# Patient Record
Sex: Female | Born: 1975 | Race: White | Hispanic: Yes | Marital: Single | Smoking: Never smoker
Health system: Southern US, Community
[De-identification: ages and names within clinical notes are randomized; demographics above are authoritative.]

## PROBLEM LIST (undated history)

## (undated) DIAGNOSIS — J45909 Unspecified asthma, uncomplicated: Secondary | ICD-10-CM

## (undated) HISTORY — PX: KNEE SURGERY: SHX244

## (undated) HISTORY — PX: BREAST SURGERY: SHX581

---

## 1997-08-29 ENCOUNTER — Ambulatory Visit (HOSPITAL_COMMUNITY): Admission: RE | Admit: 1997-08-29 | Discharge: 1997-08-29 | Payer: Self-pay | Admitting: Obstetrics

## 1997-08-29 ENCOUNTER — Other Ambulatory Visit: Admission: RE | Admit: 1997-08-29 | Discharge: 1997-08-29 | Payer: Self-pay | Admitting: Obstetrics

## 1997-10-06 ENCOUNTER — Ambulatory Visit (HOSPITAL_COMMUNITY): Admission: RE | Admit: 1997-10-06 | Discharge: 1997-10-06 | Payer: Self-pay | Admitting: Obstetrics

## 1998-01-04 ENCOUNTER — Encounter: Payer: Self-pay | Admitting: Obstetrics

## 1998-01-04 ENCOUNTER — Inpatient Hospital Stay (HOSPITAL_COMMUNITY): Admission: AD | Admit: 1998-01-04 | Discharge: 1998-01-04 | Payer: Self-pay | Admitting: Obstetrics

## 1998-01-09 ENCOUNTER — Inpatient Hospital Stay (HOSPITAL_COMMUNITY): Admission: AD | Admit: 1998-01-09 | Discharge: 1998-01-12 | Payer: Self-pay | Admitting: Obstetrics & Gynecology

## 1998-12-07 ENCOUNTER — Encounter (HOSPITAL_BASED_OUTPATIENT_CLINIC_OR_DEPARTMENT_OTHER): Payer: Self-pay | Admitting: General Surgery

## 1998-12-11 ENCOUNTER — Ambulatory Visit (HOSPITAL_COMMUNITY): Admission: RE | Admit: 1998-12-11 | Discharge: 1998-12-11 | Payer: Self-pay | Admitting: General Surgery

## 1999-01-30 ENCOUNTER — Encounter: Admission: RE | Admit: 1999-01-30 | Discharge: 1999-01-30 | Payer: Self-pay | Admitting: *Deleted

## 1999-05-09 ENCOUNTER — Encounter: Admission: RE | Admit: 1999-05-09 | Discharge: 1999-05-24 | Payer: Self-pay

## 2003-04-08 ENCOUNTER — Encounter: Admission: RE | Admit: 2003-04-08 | Discharge: 2003-04-08 | Payer: Self-pay | Admitting: *Deleted

## 2003-04-14 ENCOUNTER — Encounter: Admission: RE | Admit: 2003-04-14 | Discharge: 2003-04-14 | Payer: Self-pay | Admitting: Family Medicine

## 2003-04-14 ENCOUNTER — Inpatient Hospital Stay (HOSPITAL_COMMUNITY): Admission: AD | Admit: 2003-04-14 | Discharge: 2003-04-14 | Payer: Self-pay | Admitting: Family Medicine

## 2003-04-15 ENCOUNTER — Inpatient Hospital Stay (HOSPITAL_COMMUNITY): Admission: AD | Admit: 2003-04-15 | Discharge: 2003-04-15 | Payer: Self-pay | Admitting: *Deleted

## 2003-04-18 ENCOUNTER — Inpatient Hospital Stay (HOSPITAL_COMMUNITY): Admission: AD | Admit: 2003-04-18 | Discharge: 2003-04-21 | Payer: Self-pay | Admitting: Obstetrics & Gynecology

## 2005-01-30 ENCOUNTER — Inpatient Hospital Stay (HOSPITAL_COMMUNITY): Admission: AD | Admit: 2005-01-30 | Discharge: 2005-01-30 | Payer: Self-pay | Admitting: Obstetrics

## 2005-05-06 ENCOUNTER — Inpatient Hospital Stay (HOSPITAL_COMMUNITY): Admission: AD | Admit: 2005-05-06 | Discharge: 2005-05-09 | Payer: Self-pay | Admitting: Obstetrics

## 2006-10-23 ENCOUNTER — Ambulatory Visit (HOSPITAL_COMMUNITY): Admission: RE | Admit: 2006-10-23 | Discharge: 2006-10-23 | Payer: Self-pay | Admitting: Obstetrics & Gynecology

## 2006-11-19 ENCOUNTER — Encounter: Payer: Self-pay | Admitting: Obstetrics & Gynecology

## 2006-11-19 ENCOUNTER — Inpatient Hospital Stay (HOSPITAL_COMMUNITY): Admission: AD | Admit: 2006-11-19 | Discharge: 2006-11-21 | Payer: Self-pay | Admitting: Obstetrics & Gynecology

## 2009-09-28 ENCOUNTER — Emergency Department (HOSPITAL_COMMUNITY): Admission: EM | Admit: 2009-09-28 | Discharge: 2009-09-28 | Payer: Self-pay | Admitting: Family Medicine

## 2010-07-24 NOTE — Op Note (Signed)
Denise Sampson, Denise Sampson  ACCOUNT NO.:  [id number]   MEDICAL RECORD NO.:  [id number]          PATIENT TYPE:  INP   LOCATION:  9107                          FACILITY:  [facility]   PHYSICIAN:  Roseanna Rainbow, M.D.DATE OF BIRTH:  1975-06-07   DATE OF PROCEDURE:  11/19/2006  DATE OF DISCHARGE:                               OPERATIVE REPORT   DATE OF DELIVERY:  November 19, 2006.   The patient was fully dilated and pushing.  The fetal heart tracing was  remarkable for repetitive moderate variable decelerations with a  saltatory pattern.  The position was felt to be occiput anterior and the  station was +3.  The decision was made to proceed with a vacuum-assisted  delivery.  The vacuum was applied appropriately.  With one application  there was crowning.  The oropharynx was suctioned.  The shoulders and  body were then delivered.  The cord was clamped and cut.  The infant was  then taken over to the warmer.  The placenta was then removed with  assistance--intact/3 vessel cord.  Upon inspection of the perineum, it  was intact.  There were vaginal abrasions that were hemostatic.  The  estimated blood loss was less than 500 mL.  The mother and infant were  in stable condition.      Roseanna Rainbow, M.D.  Electronically Signed     LAJ/MEDQ  D:  11/19/2006  T:  11/20/2006  Job:  [id number]

## 2010-07-27 NOTE — Op Note (Signed)
NAMEANGELIKA, JERRETT  ACCOUNT NO.:  [id number]   MEDICAL RECORD NO.:  [id number]          PATIENT TYPE:  INP   LOCATION:  9120                          [facility]ACILITY:  [facility]   PHYSICIAN:  Kathreen Cosier, M.D.DATE OF BIRTH:  [birth year]   DATE OF PROCEDURE:  05/06/2005  DATE OF DISCHARGE:                                 OPERATIVE REPORT   PREOPERATIVE DIAGNOSIS:  Failure to progress in labor.   SURGEON:  Kathreen Cosier, M.D.   ANESTHESIA:  Spinal.   DESCRIPTION OF PROCEDURE:  The patient placed on the operating table in the  supine position.  Abdomen prepped and draped.  Bladder emptied with Foley  catheter.  Transverse suprapubic incision made, carried down through the  rectus fascia.  The fascia was cleaned and incised the length of the  incision.  Rectus muscle were retracted laterally.  The peritoneum was  incised longitudinally.  Transverse incision made in the vesicouterine  peritoneum above the bladder.  The bladder mobilized inferiorly.  Transverse  lower uterine incision made.  The patient was delivered from the OP position  of a female.  The Apgars were 9 and 9.  Weight 8 pounds 9 ounces.  The team  was in attendance.  Fluid was clear.  Placenta was posterior and removed  manually.  Uterine cavity was cleaned with dry laps.  Uterine incision  closed in two layers with continuous suture of #1 chromic.  Hemostasis was  satisfactory.  Bladder flap reattached with 2-0 chromic.  Uterus was well  contracted.  Tubes and ovaries normal.  Abdomen closed in layers.  Peritoneum with continuous suture of 0 chromic, fascia with continuous  suture of 0 Dexon and skin closed with subcuticular stitch of 4-0 Monocryl.  Blood loss 500 mL.           ______________________________  Kathreen Cosier, M.D.     BAM/MEDQ  D:  05/06/2005  T:  05/07/2005  Job:  [id number]

## 2010-07-27 NOTE — Discharge Summary (Signed)
[name], [name]  ACCOUNT NO.:  [id number]   MEDICAL RECORD NO.:  [id number]          PATIENT TYPE:  INP   LOCATION:  9120                          [facility]ILITY:  [facility]   PHYSICIAN:  Kathreen Cosier, M.D.DATE OF BIRTH:  Mar 30, 1975   DATE OF ADMISSION:  05/06/2005  DATE OF DISCHARGE:  05/09/2005                                 DISCHARGE SUMMARY   The patient is a 35 year old gravida 3, para 2-0-0-2, [facility] March 19, who was  admitted at 41 weeks for induction.  The patient progressed to 9 cm and  underwent primary low-transverse cesarean section because of failure to  progress.  She had an 8 pound 9 ounce female from the OP position.  Postoperatively, she did well.  On admission, her hemoglobin was 12.8 and  postop 10.6.  She was discharged home on the 3rd postoperative day  ambulatory on a regular diet to see me in six weeks.   DISCHARGE DIAGNOSIS:  Status post primary low-transverse cesarean section at  term for failure to rest.           ______________________________  Kathreen Cosier, M.D.     BAM/MEDQ  D:  05/29/2005  T:  05/29/2005  Job:  [id number]

## 2010-07-27 NOTE — H&P (Signed)
NAMEDARIS, ARISTIZABAL  ACCOUNT NO.:  [id number]   MEDICAL RECORD NO.:  [id number]          PATIENT TYPE:  INP   LOCATION:  9120                          [facility]ILITY:  [facility]   PHYSICIAN:  Kathreen Cosier, M.D.DATE OF BIRTH:  March 24, 1975   DATE OF ADMISSION:  05/06/2005  DATE OF DISCHARGE:                                HISTORY & PHYSICAL   The patient is a 35 year old gravida 3, para 2-0-0-2 and [facility] April 29, 2005.  She was admitted at 41 weeks for induction of labor.  Her GBS was  negative.  Cervix was 5 cm, 90% and vertex -2 to -3 station.  At 3:10 p.m.,  the amniotomy was performed and the fluid was clear.  IUPC was inserted and  she was started on Pitocin.  By 6 p.m., she was fully dilated and +1  station.  She was fully dilated except for an anterior lip and by 7:50 p.m.  the anterior lip was still present when she pushed.  We could reduce the  lip, but it would not stay reduced and there was no descent of the fetus  when she pushed.  It was decided she would be delivered by C-section because  of microsomia CPD.   PHYSICAL EXAMINATION:  GENERAL:  An obese female in labor.  HEENT:  Negative.  LUNGS:  Clear.  HEART:  Regular rhythm with no murmurs and no gallops.  BREASTS:  Engorged with no masses.  ABDOMEN:  Term-sized uterus, estimated fetal weight greater than 8 pounds.  PELVIC:  Findings are as described above.  EXTREMITIES:  Negative.           ______________________________  Kathreen Cosier, M.D.     BAM/MEDQ  D:  05/06/2005  T:  05/07/2005  Job:  [id number]

## 2010-12-13 ENCOUNTER — Emergency Department (HOSPITAL_COMMUNITY): Payer: Self-pay

## 2010-12-13 ENCOUNTER — Emergency Department (HOSPITAL_COMMUNITY)
Admission: EM | Admit: 2010-12-13 | Discharge: 2010-12-13 | Disposition: A | Discharge status: Home / Self Care | Payer: Self-pay | Attending: Emergency Medicine | Admitting: Emergency Medicine

## 2010-12-13 ENCOUNTER — Inpatient Hospital Stay (INDEPENDENT_AMBULATORY_CARE_PROVIDER_SITE_OTHER)
Admission: RE | Admit: 2010-12-13 | Discharge: 2010-12-13 | Disposition: A | Discharge status: Home / Self Care | Payer: Self-pay | Source: Ambulatory Visit | Attending: Emergency Medicine | Admitting: Emergency Medicine

## 2010-12-13 DIAGNOSIS — R062 Wheezing: Secondary | ICD-10-CM | POA: Insufficient documentation

## 2010-12-13 DIAGNOSIS — J40 Bronchitis, not specified as acute or chronic: Secondary | ICD-10-CM | POA: Insufficient documentation

## 2010-12-13 DIAGNOSIS — R0602 Shortness of breath: Secondary | ICD-10-CM | POA: Insufficient documentation

## 2010-12-13 DIAGNOSIS — R509 Fever, unspecified: Secondary | ICD-10-CM | POA: Insufficient documentation

## 2010-12-13 DIAGNOSIS — R079 Chest pain, unspecified: Secondary | ICD-10-CM | POA: Insufficient documentation

## 2010-12-13 DIAGNOSIS — R059 Cough, unspecified: Secondary | ICD-10-CM | POA: Insufficient documentation

## 2010-12-13 DIAGNOSIS — R05 Cough: Secondary | ICD-10-CM | POA: Insufficient documentation

## 2010-12-13 LAB — DIFFERENTIAL
Basophils Absolute: 0.1 10*3/uL (ref 0.0–0.1)
Basophils Relative: 0 % (ref 0–1)
Eosinophils Absolute: 0.6 10*3/uL (ref 0.0–0.7)
Eosinophils Relative: 4 % (ref 0–5)
Lymphocytes Relative: 24 % (ref 12–46)
Lymphs Abs: 3.6 10*3/uL (ref 0.7–4.0)
Monocytes Absolute: 0.8 10*3/uL (ref 0.1–1.0)
Monocytes Relative: 5 % (ref 3–12)
Neutro Abs: 9.9 10*3/uL — ABNORMAL HIGH (ref 1.7–7.7)
Neutrophils Relative %: 66 % (ref 43–77)

## 2010-12-13 LAB — CBC
HCT: 39.8 % (ref 36.0–46.0)
Hemoglobin: 14.4 g/dL (ref 12.0–15.0)
MCH: 31.6 pg (ref 26.0–34.0)
MCHC: 36.2 g/dL — ABNORMAL HIGH (ref 30.0–36.0)
MCV: 87.5 fL (ref 78.0–100.0)
Platelets: 361 10*3/uL (ref 150–400)
RBC: 4.55 MIL/uL (ref 3.87–5.11)
RDW: 13.2 % (ref 11.5–15.5)
WBC: 15 10*3/uL — ABNORMAL HIGH (ref 4.0–10.5)

## 2010-12-13 LAB — POCT I-STAT, CHEM 8
BUN: 6 mg/dL (ref 6–23)
Calcium, Ion: 1.13 mmol/L (ref 1.12–1.32)
Chloride: 104 mEq/L (ref 96–112)
Potassium: 3.5 mEq/L (ref 3.5–5.1)

## 2010-12-13 LAB — D-DIMER, QUANTITATIVE: D-Dimer, Quant: 0.34 ug/mL-FEU (ref 0.00–0.48)

## 2010-12-21 LAB — CBC
HCT: 32.9 — ABNORMAL LOW
HCT: 38.6
Hemoglobin: 11.4 — ABNORMAL LOW
Hemoglobin: 13.5
MCHC: 34.7
MCV: 87.7
RBC: 3.75 — ABNORMAL LOW
RBC: 4.42
RDW: 15 — ABNORMAL HIGH
WBC: 18.5 — ABNORMAL HIGH

## 2012-05-31 ENCOUNTER — Encounter (HOSPITAL_COMMUNITY): Payer: Self-pay | Admitting: *Deleted

## 2012-05-31 ENCOUNTER — Observation Stay (HOSPITAL_COMMUNITY)
Admission: EM | Admit: 2012-05-31 | Discharge: 2012-06-02 | Disposition: A | Discharge status: Home / Self Care | Payer: Self-pay | Attending: Family Medicine | Admitting: Family Medicine

## 2012-05-31 DIAGNOSIS — R0603 Acute respiratory distress: Secondary | ICD-10-CM

## 2012-05-31 DIAGNOSIS — R0989 Other specified symptoms and signs involving the circulatory and respiratory systems: Secondary | ICD-10-CM | POA: Insufficient documentation

## 2012-05-31 DIAGNOSIS — R7989 Other specified abnormal findings of blood chemistry: Secondary | ICD-10-CM | POA: Insufficient documentation

## 2012-05-31 DIAGNOSIS — R05 Cough: Secondary | ICD-10-CM | POA: Insufficient documentation

## 2012-05-31 DIAGNOSIS — R000 Tachycardia, unspecified: Secondary | ICD-10-CM | POA: Insufficient documentation

## 2012-05-31 DIAGNOSIS — R0789 Other chest pain: Secondary | ICD-10-CM | POA: Insufficient documentation

## 2012-05-31 DIAGNOSIS — R0602 Shortness of breath: Secondary | ICD-10-CM | POA: Insufficient documentation

## 2012-05-31 DIAGNOSIS — J45901 Unspecified asthma with (acute) exacerbation: Principal | ICD-10-CM

## 2012-05-31 DIAGNOSIS — R059 Cough, unspecified: Secondary | ICD-10-CM | POA: Insufficient documentation

## 2012-05-31 DIAGNOSIS — R0609 Other forms of dyspnea: Secondary | ICD-10-CM | POA: Insufficient documentation

## 2012-05-31 HISTORY — DX: Unspecified asthma, uncomplicated: J45.909

## 2012-05-31 LAB — CBC WITH DIFFERENTIAL/PLATELET
Basophils Absolute: 0.1 10*3/uL (ref 0.0–0.1)
Basophils Relative: 0 % (ref 0–1)
Eosinophils Absolute: 1.5 10*3/uL — ABNORMAL HIGH (ref 0.0–0.7)
Eosinophils Relative: 9 % — ABNORMAL HIGH (ref 0–5)
HCT: 41 % (ref 36.0–46.0)
Hemoglobin: 14.7 g/dL (ref 12.0–15.0)
Lymphocytes Relative: 22 % (ref 12–46)
Lymphs Abs: 3.7 K/uL (ref 0.7–4.0)
MCH: 31.5 pg (ref 26.0–34.0)
MCHC: 35.9 g/dL (ref 30.0–36.0)
MCV: 87.8 fL (ref 78.0–100.0)
Monocytes Absolute: 1.1 10*3/uL — ABNORMAL HIGH (ref 0.1–1.0)
Monocytes Relative: 6 % (ref 3–12)
Neutro Abs: 10.9 K/uL — ABNORMAL HIGH (ref 1.7–7.7)
Neutrophils Relative %: 63 % (ref 43–77)
Platelets: 325 10*3/uL (ref 150–400)
RBC: 4.67 MIL/uL (ref 3.87–5.11)
RDW: 12.8 % (ref 11.5–15.5)
WBC: 17.2 10*3/uL — ABNORMAL HIGH (ref 4.0–10.5)

## 2012-05-31 MED ORDER — ALBUTEROL SULFATE (5 MG/ML) 0.5% IN NEBU
5.0000 mg | INHALATION_SOLUTION | Freq: Once | RESPIRATORY_TRACT | Status: AC
  Administered 2012-05-31: 5 mg via RESPIRATORY_TRACT
  Filled 2012-05-31: qty 1

## 2012-05-31 MED ORDER — IPRATROPIUM BROMIDE 0.02 % IN SOLN
0.5000 mg | Freq: Once | RESPIRATORY_TRACT | Status: AC
  Administered 2012-05-31: 0.5 mg via RESPIRATORY_TRACT
  Filled 2012-05-31: qty 2.5

## 2012-05-31 MED ORDER — ALBUTEROL SULFATE (5 MG/ML) 0.5% IN NEBU: 5.0000 mg | INHALATION_SOLUTION | Freq: Once | RESPIRATORY_TRACT | Status: DC

## 2012-05-31 MED ORDER — PREDNISONE 20 MG PO TABS
60.0000 mg | ORAL_TABLET | Freq: Once | ORAL | Status: AC
  Administered 2012-05-31: 60 mg via ORAL
  Filled 2012-05-31: qty 3

## 2012-05-31 NOTE — ED Provider Notes (Signed)
History     CSN: [id number]  Arrival date & time 05/31/12  2142   First MD Initiated Contact with Patient 05/31/12 2250      Chief Complaint  Patient presents with  . Asthma    (Consider location/radiation/quality/duration/timing/severity/associated sxs/prior treatment) HPI Comments: Level 5 caveat due to language barrier and acuity of symptoms.  Pt with h/o asthma, has been hospitalized in the past, never intubated, presents with 4 days of worsening SOB, chest tight similar to prior episodes, but much worse times 1 day.  Not improved with inhaler at home.  Coughing, no fever or chills, no N/V.    Patient is a 37 y.o. female presenting with asthma. The history is provided by the patient and a relative. The history is limited by a language barrier.  Asthma    Past Medical History  Diagnosis Date  . Asthma     Past Surgical History  Procedure Laterality Date  . Knee surgery    . Breast surgery      History reviewed. No pertinent family history.  History  Substance Use Topics  . Smoking status: Never Smoker   . Smokeless tobacco: Not on file  . Alcohol Use: No    OB History   Grav Para Term Preterm Abortions TAB SAB Ect Mult Living                  Review of Systems  Unable to perform ROS: Other    Allergies  Review of patient's allergies indicates no known allergies.  Home Medications   Current Outpatient Rx  Name  Route  Sig  Dispense  Refill  . albuterol (PROVENTIL HFA;VENTOLIN HFA) 108 (90 BASE) MCG/ACT inhaler   Inhalation   Inhale 2 puffs into the lungs every 6 (six) hours as needed for wheezing.         . phentermine (ADIPEX-P) 37.5 MG tablet   Oral   Take 37.5 mg by mouth daily before breakfast.           BP 115/66  Pulse 107  Resp 19  SpO2 99%  Physical Exam  Nursing note and vitals reviewed. Constitutional: She is oriented to person, place, and time. She appears well-developed and well-nourished. She appears distressed.  HENT:   Head: Normocephalic and atraumatic.  Eyes: EOM are normal. Pupils are equal, round, and reactive to light.  Neck: Normal range of motion. Neck supple. No JVD present.  Cardiovascular: Regular rhythm and intact distal pulses.  Tachycardia present.   No murmur heard. Pulmonary/Chest: Accessory muscle usage present. No stridor. Tachypnea noted. She is in respiratory distress. She has wheezes. She has no rhonchi. She has no rales.  Abdominal: Soft. She exhibits no distension. There is no tenderness.  Musculoskeletal: She exhibits no edema.  Neurological: She is alert and oriented to person, place, and time. No cranial nerve deficit. Coordination normal.  Skin: Skin is warm and dry. No rash noted. She is not diaphoretic.  Psychiatric: She has a normal mood and affect.    ED Course  Procedures (including critical care time)  Labs Reviewed  CBC WITH DIFFERENTIAL - Abnormal; Notable for the following:    WBC 17.2 (*)    Neutro Abs 10.9 (*)    Monocytes Absolute 1.1 (*)    Eosinophils Relative 9 (*)    Eosinophils Absolute 1.5 (*)    All other components within normal limits  BASIC METABOLIC PANEL   No results found.   1. Asthma exacerbation   2.  Respiratory distress     ra sat is 93% which I interpret to be low and inadequate.  Improved to 95% on [place] O2.  12:03 AM After 3 nebs total, pt still with sig insp and exp wheezing, bilaterally, RA sats at 93%, still increased work of breathing.  Pt seems somewhat hesitant to be admitted.  Pt is at 97% on 3L [place].  Will allow some time, try ambulation.  If pt cannot tolerate or becomes hypoxemic, will admit.  WBC is up at 17, however I feel it is a stress reaction.  Pt has had leukocytosis on every prior ED visit.    12:36 AM With ambulation on RA, sats drop to 89% and pt continues to wheeze.  Will admit to medicine for continued breathing treatments.   MDM  Pt with sig bilateral wheezing, consistent with asthma exacerbation.  No assymetry on  auscultation, no fever.  No need for CXR at this time. Pt received 1 neb at triage and reports no sig improvement.  Here, will gie a total of 3 nebs, oral steroids, will reassess.  Cardiac monitor. Doubt this is cardiac given wheezing, age.          Gavin Pound. Oletta Lamas, MD 06/01/12 1610

## 2012-05-31 NOTE — ED Notes (Signed)
Pt states that she is having and asthma attack. Pt tried her home inhaler with no relief. Pt very SOB, pt labored and wheezing. Pt has not tried an nebulizer before.

## 2012-05-31 NOTE — ED Notes (Addendum)
Pt still having SOB after nebulizer. Pt still feels tight. Pt diaphoretic as well.

## 2012-06-01 ENCOUNTER — Encounter (HOSPITAL_COMMUNITY): Payer: Self-pay | Admitting: Emergency Medicine

## 2012-06-01 DIAGNOSIS — R0609 Other forms of dyspnea: Secondary | ICD-10-CM

## 2012-06-01 DIAGNOSIS — R0989 Other specified symptoms and signs involving the circulatory and respiratory systems: Secondary | ICD-10-CM

## 2012-06-01 DIAGNOSIS — J45901 Unspecified asthma with (acute) exacerbation: Principal | ICD-10-CM

## 2012-06-01 LAB — BASIC METABOLIC PANEL
CO2: 26 mEq/L (ref 19–32)
Calcium: 9 mg/dL (ref 8.4–10.5)
Calcium: 9 mg/dL (ref 8.4–10.5)
Creatinine, Ser: 0.61 mg/dL (ref 0.50–1.10)
GFR calc Af Amer: >90 mL/min (ref >90)
GFR calc non Af Amer: >90 mL/min (ref >90)
GFR calc non Af Amer: >90 mL/min (ref >90)
Potassium: 3.9 mEq/L (ref 3.5–5.1)
Sodium: 141 mEq/L (ref 135–145)
Sodium: 139 mEq/L (ref 135–145)

## 2012-06-01 LAB — BASIC METABOLIC PANEL WITH GFR
BUN: 8 mg/dL (ref 6–23)
Chloride: 105 meq/L (ref 96–112)
GFR calc Af Amer: >90 mL/min (ref >90)
Glucose, Bld: 113 mg/dL — ABNORMAL HIGH (ref 70–99)
Potassium: 3.7 meq/L (ref 3.5–5.1)

## 2012-06-01 LAB — GLUCOSE, CAPILLARY: Glucose-Capillary: 144 mg/dL — ABNORMAL HIGH (ref 70–99)

## 2012-06-01 MED ORDER — ALBUTEROL SULFATE (5 MG/ML) 0.5% IN NEBU
2.5000 mg | INHALATION_SOLUTION | Freq: Two times a day (BID) | RESPIRATORY_TRACT | Status: DC
  Administered 2012-06-01: 2.5 mg via RESPIRATORY_TRACT

## 2012-06-01 MED ORDER — PREDNISONE 20 MG PO TABS
40.0000 mg | ORAL_TABLET | Freq: Every day | ORAL | Status: DC
  Administered 2012-06-01 – 2012-06-02 (×2): 40 mg via ORAL
  Filled 2012-06-01 (×4): qty 2

## 2012-06-01 MED ORDER — IPRATROPIUM BROMIDE 0.02 % IN SOLN
0.5000 mg | Freq: Two times a day (BID) | RESPIRATORY_TRACT | Status: DC
  Administered 2012-06-01 – 2012-06-02 (×3): 0.5 mg via RESPIRATORY_TRACT
  Filled 2012-06-01 (×3): qty 2.5

## 2012-06-01 MED ORDER — GUAIFENESIN-DM 100-10 MG/5ML PO SYRP
5.0000 mL | ORAL_SOLUTION | ORAL | Status: DC | PRN
  Administered 2012-06-01: 5 mL via ORAL
  Filled 2012-06-01: qty 5

## 2012-06-01 MED ORDER — SODIUM CHLORIDE 0.9 % IJ SOLN
3.0000 mL | Freq: Two times a day (BID) | INTRAMUSCULAR | Status: DC
  Administered 2012-06-01 – 2012-06-02 (×3): 3 mL via INTRAVENOUS

## 2012-06-01 MED ORDER — ALBUTEROL SULFATE (5 MG/ML) 0.5% IN NEBU
2.5000 mg | INHALATION_SOLUTION | RESPIRATORY_TRACT | Status: DC | PRN
  Filled 2012-06-01: qty 0.5

## 2012-06-01 MED ORDER — MAGNESIUM SULFATE 40 MG/ML IJ SOLN
2.0000 g | Freq: Once | INTRAMUSCULAR | Status: AC
  Administered 2012-06-01: 2 g via INTRAVENOUS
  Filled 2012-06-01: qty 50

## 2012-06-01 MED ORDER — ALBUTEROL SULFATE (5 MG/ML) 0.5% IN NEBU
2.5000 mg | INHALATION_SOLUTION | RESPIRATORY_TRACT | Status: DC
  Administered 2012-06-01 – 2012-06-02 (×6): 2.5 mg via RESPIRATORY_TRACT
  Filled 2012-06-01 (×6): qty 0.5

## 2012-06-01 NOTE — Progress Notes (Signed)
[facility] PROGRESS NOTE  Elta Angell ZOX:[id number] DOB: Jul 22, 1975 DOA: 05/31/2012 PCP: Roosevelt Locks, MD Brief narrative 37 year old female with history of asthma presenting with 4 days history of worsening shortness of breath with symptoms off asthma exacerbation  Assessment/Plan: Acute asthma exacerbation  Continue with oral steroid, when necessary and scheduled albuterol nebs. -Still has some symptoms all improved and needs one more day often nebs and monitoring. Can be discharged in the morning if stable.   Code Status: Full code Family Communication: Daughter at bedside Disposition Plan: Home likely tomorrow if symptoms improve   Consultants:  None  Procedures:  None  Antibiotics:  None  HPI/Subjective: Informs breathing to be better. History provided by daughter at bedside. Still has wheezing.  Objective: Filed Vitals:   06/01/12 0211 06/01/12 0228 06/01/12 0236 06/01/12 0601  BP:   134/72 125/79  Pulse:   111 116  Temp:   97.9 F (36.6 C) 98.1 F (36.7 C)  TempSrc:   Oral Oral  Resp:   22 22  Height:  5' (1.524 m)    Weight:  97.2 kg (214 lb 4.6 oz)  97.2 kg (214 lb 4.6 oz)  SpO2: 99%  93% 96%    Intake/Output Summary (Last 24 hours) at 06/01/12 1327 Last data filed at 06/01/12 1000  Gross per 24 hour  Intake    920 ml  Output   1600 ml  Net   -680 ml   Filed Weights   06/01/12 0228 06/01/12 0601  Weight: 97.2 kg (214 lb 4.6 oz) 97.2 kg (214 lb 4.6 oz)    Exam:   General: Middle aged obese female in no acute distress  HEENT: No pallor, moist oral mucosa  Cardiovascular: There to auscultation bilaterally, no added sounds  Respiratory: Bilateral expiratory wheezes  Abdomen: Soft, nontender, nondistended, bowel sounds present  Musculoskeletal: Warm, no edema  CNS: AAO x3  Data Reviewed: Basic Metabolic Panel:  Recent Labs Lab 05/31/12 2320 06/01/12 0445  NA 141 139  K 3.7 3.9  CL 105 104  CO2 26 22   GLUCOSE 113* 155*  BUN 8 6  CREATININE 0.61 0.53  CALCIUM 9.0 9.0   Liver Function Tests: No results found for this basename: AST, ALT, ALKPHOS, BILITOT, PROT, ALBUMIN,  in the last 168 hours No results found for this basename: LIPASE, AMYLASE,  in the last 168 hours No results found for this basename: AMMONIA,  in the last 168 hours CBC:  Recent Labs Lab 05/31/12 2320  WBC 17.2*  NEUTROABS 10.9*  HGB 14.7  HCT 41.0  MCV 87.8  PLT 325   Cardiac Enzymes: No results found for this basename: CKTOTAL, CKMB, CKMBINDEX, TROPONINI,  in the last 168 hours BNP (last 3 results) No results found for this basename: PROBNP,  in the last 8760 hours CBG:  Recent Labs Lab 06/01/12 0742  GLUCAP 144*    No results found for this or any previous visit (from the past 240 hour(s)).   Studies: No results found.  Scheduled Meds: . albuterol  2.5 mg Nebulization BID  . ipratropium  0.5 mg Nebulization BID  . predniSONE  40 mg Oral Q breakfast  . sodium chloride  3 mL Intravenous Q12H   Continuous Infusions:     Time spent: 15 minutes    Joyann Spidle  [facility] Pager [phone]. If 7PM-7AM, please contact night-coverage at [email or web address], password [facility] 06/01/2012, 1:27 PM  LOS: 1 day

## 2012-06-01 NOTE — Progress Notes (Signed)
Utilization Review Completed.   Mishel Sans, RN, BSN Nurse Case Manager  [phone]  

## 2012-06-01 NOTE — H&P (Signed)
[facility] History and Physical  Allayna Erlich ZOX:[id number] DOB: 1975-08-17 DOA: 05/31/2012  Referring physician: ED PCP: Roosevelt Locks, MD  Specialists: None  Chief Complaint: Asthma exacerbation  HPI: Denise Sampson is a 37 y.o. female with PMH of asthma.  Hospitalized for this in the past.  Presents with 4 day onset of worsening SOB.  Chest tightness and symptoms similar to prior episodes.  No improvement with inhalor at home.  Has coughing but no fever nor chills, no N/V.  In ED patient was given neb treatments and steroids which did not improve her symptoms enough, she was still desating down to 89% with ambulation.  Hospitalist admitting to obs.  Review of Systems: 12 systems reviewed and otherwise negative.  Past Medical History  Diagnosis Date  . Asthma    Past Surgical History  Procedure Laterality Date  . Knee surgery    . Breast surgery     Social History:  reports that she has never smoked. She does not have any smokeless tobacco history on file. She reports that she does not drink alcohol or use illicit drugs.   No Known Allergies  History reviewed. No pertinent family history.  Prior to Admission medications   Medication Sig Start Date End Date Taking? Authorizing Provider  albuterol (PROVENTIL HFA;VENTOLIN HFA) 108 (90 BASE) MCG/ACT inhaler Inhale 2 puffs into the lungs every 6 (six) hours as needed for wheezing.   Yes Historical Provider, MD  phentermine (ADIPEX-P) 37.5 MG tablet Take 37.5 mg by mouth daily before breakfast.   Yes Historical Provider, MD   Physical Exam: Filed Vitals:   06/01/12 0130 06/01/12 0211 06/01/12 0228 06/01/12 0236  BP: 108/63   134/72  Pulse: 98   111  Temp:    97.9 F (36.6 C)  TempSrc:    Oral  Resp: 24   22  Height:   5' (1.524 m)   Weight:   97.2 kg (214 lb 4.6 oz)   SpO2: 97% 99%  93%    General:  NAD, resting comfortably in bed Eyes: PEERLA EOMI ENT: mucous membranes moist Neck:  supple w/o JVD Cardiovascular: RRR w/o MRG Respiratory: B wheezes in all lung fields, breathing w/o accessory muscle use Abdomen: soft, nt, nd, bs+ Skin: no rash nor lesion Musculoskeletal: MAE, full ROM all 4 extremities Psychiatric: normal tone and affect Neurologic: AAOx3, grossly non-focal  Labs on Admission:  Basic Metabolic Panel:  Recent Labs Lab 05/31/12 2320  NA 141  K 3.7  CL 105  CO2 26  GLUCOSE 113*  BUN 8  CREATININE 0.61  CALCIUM 9.0   Liver Function Tests: No results found for this basename: AST, ALT, ALKPHOS, BILITOT, PROT, ALBUMIN,  in the last 168 hours No results found for this basename: LIPASE, AMYLASE,  in the last 168 hours No results found for this basename: AMMONIA,  in the last 168 hours CBC:  Recent Labs Lab 05/31/12 2320  WBC 17.2*  NEUTROABS 10.9*  HGB 14.7  HCT 41.0  MCV 87.8  PLT 325   Cardiac Enzymes: No results found for this basename: CKTOTAL, CKMB, CKMBINDEX, TROPONINI,  in the last 168 hours  BNP (last 3 results) No results found for this basename: PROBNP,  in the last 8760 hours CBG: No results found for this basename: GLUCAP,  in the last 168 hours  Radiological Exams on Admission: No results found.  EKG: Independently reviewed.  Assessment/Plan Active Problems:   Asthma with acute exacerbation   1. Asthma exacerbation - admitting  patient to obs, giving Mg IV, PRN neb treatments, RRT to eval and treat, continuing PO steroids, expect however that patient will likely be stabilized within 24 hours.  This of course may change during the course of the day.    Code Status: Full Code (must indicate code status--if unknown or must be presumed, indicate so) Family Communication: Spoke with daughter at bedside (indicate person spoken with, if applicable, with phone number if by telephone) Disposition Plan: Admit to obs (indicate anticipated LOS)  Time spent: 50 min  Ebb Carelock M. [facility] Pager  [phone]  If 7PM-7AM, please contact night-coverage [email or web address] Password [facility] 06/01/2012, 3:06 AM

## 2012-06-01 NOTE — Progress Notes (Signed)
Pt HR in 120s t0 140s when coughing. Will continue to monitor

## 2012-06-01 NOTE — Progress Notes (Signed)
Pt admitted to 4700, room 4738 from ER, Alert and oriented c/o SOB. VSS, denies any chest pain or discomfort. Pt placed on telemetry monitor ST on low hundreds. Pt oriented to the unit and to her room and encouraged to call for assistance to get OOB to prevent any fall or injury while in the hospital. We'll continue with POC.

## 2012-06-02 DIAGNOSIS — J9600 Acute respiratory failure, unspecified whether with hypoxia or hypercapnia: Secondary | ICD-10-CM

## 2012-06-02 LAB — GLUCOSE, CAPILLARY: Glucose-Capillary: 107 mg/dL — ABNORMAL HIGH (ref 70–99)

## 2012-06-02 MED ORDER — PREDNISONE 10 MG PO TABS: ORAL_TABLET | ORAL | Status: DC

## 2012-06-02 MED ORDER — GUAIFENESIN-DM 100-10 MG/5ML PO SYRP: 5.0000 mL | ORAL_SOLUTION | ORAL | Status: DC | PRN

## 2012-06-02 MED ORDER — ALBUTEROL SULFATE HFA 108 (90 BASE) MCG/ACT IN AERS: 2.0000 | INHALATION_SPRAY | Freq: Four times a day (QID) | RESPIRATORY_TRACT | Status: DC | PRN

## 2012-06-02 NOTE — Progress Notes (Signed)
06/02/12 1230 TC from [place], RN for pt., in reference to pt. needing medication assistance.  Spanish interpeter in room, as pt. speaks [place].  Explained pt. can get medication assistance from the [facility] program, the [facility] card can be used within 7days of dc, and pt. can only use the Pharmacies listed on the [facility] list.  This NCM gave pt. MATCH letter.  Pt. to dc home today. Tera Mater, RN, BSN NCM [phone]

## 2012-06-02 NOTE — Discharge Summary (Signed)
Physician Discharge Summary  Dennis Killilea WUX:[id number] DOB: 01/01/1976 DOA: 05/31/2012  PCP: Roosevelt Locks, MD  Admit date: 05/31/2012 Discharge date: 06/02/2012  Time spent: 50 minutes  Recommendations for Outpatient Follow-up:  Follow up PCP in 2 weeks  Discharge Diagnoses:  Active Problems:   Asthma with acute exacerbation   Discharge Condition: Stable  Diet recommendation: Regular diet  Filed Weights   06/01/12 0228 06/01/12 0601 06/02/12 0527  Weight: 97.2 kg (214 lb 4.6 oz) 97.2 kg (214 lb 4.6 oz) 97.206 kg (214 lb 4.8 oz)    History of present illness:  37 y.o. female with PMH of asthma. Hospitalized for this in the past. Presents with 4 day onset of worsening SOB. Chest tightness and symptoms similar to prior episodes. No improvement with inhalor at home. Has coughing but no fever nor chills, no N/V.   Hospital Course:  Acute asthma exacerbation  Patient was started on prednisone , received albuterol nebulizer treatment. She has now improved. Will d/c home  Leukocutosis Secondary to steroids  Procedures:  None  Consultations:  None  Discharge Exam: Filed Vitals:   06/01/12 2009 06/01/12 2052 06/02/12 0527 06/02/12 0915  BP:  136/73 137/86   Pulse:  100 105   Temp:  98.4 F (36.9 C) 98 F (36.7 C)   TempSrc:  Oral Oral   Resp:  20 20   Height:      Weight:   97.206 kg (214 lb 4.8 oz)   SpO2: 94% 92% 93% 99%    General: Appear in no acute distress Cardiovascular: s1s2 RRR Respiratory: Clear bilaterally Ext: No edema  Discharge Instructions  Discharge Orders   Future Orders Complete By Expires     Diet - low sodium heart healthy  As directed     Increase activity slowly  As directed         Medication List    TAKE these medications       albuterol 108 (90 BASE) MCG/ACT inhaler  Commonly known as:  PROVENTIL HFA;VENTOLIN HFA  Inhale 2 puffs into the lungs every 6 (six) hours as needed for wheezing.     guaiFENesin-dextromethorphan 100-10 MG/5ML syrup  Commonly known as:  ROBITUSSIN DM  Take 5 mLs by mouth every 4 (four) hours as needed for cough.     phentermine 37.5 MG tablet  Commonly known as:  ADIPEX-P  Take 37.5 mg by mouth daily before breakfast.     predniSONE 10 MG tablet  Commonly known as:  DELTASONE  Prednisone 40 mg po daily x 1 day then  Prednisone 30 mg po daily x 1 day then  Prednisone 20 mg po daily x 1 day then  Prednisone 10 mg daily x 1 day then stop...          The results of significant diagnostics from this hospitalization (including imaging, microbiology, ancillary and laboratory) are listed below for reference.    Significant Diagnostic Studies: No results found.  Microbiology: No results found for this or any previous visit (from the past 240 hour(s)).   Labs: Basic Metabolic Panel:  Recent Labs Lab 05/31/12 2320 06/01/12 0445  NA 141 139  K 3.7 3.9  CL 105 104  CO2 26 22  GLUCOSE 113* 155*  BUN 8 6  CREATININE 0.61 0.53  CALCIUM 9.0 9.0   Liver Function Tests: No results found for this basename: AST, ALT, ALKPHOS, BILITOT, PROT, ALBUMIN,  in the last 168 hours No results found for this basename: LIPASE, AMYLASE,  in the last 168 hours No results found for this basename: AMMONIA,  in the last 168 hours CBC:  Recent Labs Lab 05/31/12 2320  WBC 17.2*  NEUTROABS 10.9*  HGB 14.7  HCT 41.0  MCV 87.8  PLT 325   Cardiac Enzymes: No results found for this basename: CKTOTAL, CKMB, CKMBINDEX, TROPONINI,  in the last 168 hours BNP: BNP (last 3 results) No results found for this basename: PROBNP,  in the last 8760 hours CBG:  Recent Labs Lab 06/01/12 0742 06/02/12 0730  GLUCAP 144* 107*       Signed:  LAMA,GAGAN S  [facility] 06/02/2012, 11:24 AM

## 2012-06-02 NOTE — Progress Notes (Signed)
PT d/c home with husband. D/c instructions and medications reviewed with Pt via interpretor. Pt states understanding. All Pt questions answered. Med card giving for Medications by case magmt, note giving for son missing school and appt for follow up made by RN.

## 2012-09-04 ENCOUNTER — Emergency Department (HOSPITAL_COMMUNITY): Payer: Self-pay

## 2012-09-04 ENCOUNTER — Encounter (HOSPITAL_COMMUNITY): Payer: Self-pay | Admitting: Emergency Medicine

## 2012-09-04 ENCOUNTER — Emergency Department (INDEPENDENT_AMBULATORY_CARE_PROVIDER_SITE_OTHER): Payer: Self-pay

## 2012-09-04 ENCOUNTER — Emergency Department (HOSPITAL_COMMUNITY)
Admission: EM | Admit: 2012-09-04 | Discharge: 2012-09-04 | Disposition: A | Discharge status: Home / Self Care | Payer: Self-pay | Source: Home / Self Care | Attending: Emergency Medicine | Admitting: Emergency Medicine

## 2012-09-04 DIAGNOSIS — J4521 Mild intermittent asthma with (acute) exacerbation: Secondary | ICD-10-CM

## 2012-09-04 DIAGNOSIS — J45901 Unspecified asthma with (acute) exacerbation: Secondary | ICD-10-CM

## 2012-09-04 DIAGNOSIS — J189 Pneumonia, unspecified organism: Secondary | ICD-10-CM

## 2012-09-04 MED ORDER — ALBUTEROL SULFATE (5 MG/ML) 0.5% IN NEBU
5.0000 mg | INHALATION_SOLUTION | Freq: Once | RESPIRATORY_TRACT | Status: AC
  Administered 2012-09-04: 5 mg via RESPIRATORY_TRACT

## 2012-09-04 MED ORDER — BENZONATATE 200 MG PO CAPS: 200.0000 mg | ORAL_CAPSULE | Freq: Three times a day (TID) | ORAL | Status: DC | PRN

## 2012-09-04 MED ORDER — CEFTRIAXONE SODIUM 1 G IJ SOLR
INTRAMUSCULAR | Status: AC
  Filled 2012-09-04: qty 10

## 2012-09-04 MED ORDER — AZITHROMYCIN 250 MG PO TABS: ORAL_TABLET | ORAL | Status: DC

## 2012-09-04 MED ORDER — LIDOCAINE HCL (PF) 1 % IJ SOLN
INTRAMUSCULAR | Status: AC
  Filled 2012-09-04: qty 5

## 2012-09-04 MED ORDER — CEFTRIAXONE SODIUM 1 G IJ SOLR
1.0000 g | Freq: Once | INTRAMUSCULAR | Status: AC
  Administered 2012-09-04: 1 g via INTRAMUSCULAR

## 2012-09-04 MED ORDER — ALBUTEROL SULFATE (5 MG/ML) 0.5% IN NEBU
INHALATION_SOLUTION | RESPIRATORY_TRACT | Status: AC
  Filled 2012-09-04: qty 1

## 2012-09-04 MED ORDER — IPRATROPIUM BROMIDE 0.02 % IN SOLN
0.5000 mg | Freq: Once | RESPIRATORY_TRACT | Status: AC
  Administered 2012-09-04: 0.5 mg via RESPIRATORY_TRACT

## 2012-09-04 MED ORDER — ALBUTEROL SULFATE HFA 108 (90 BASE) MCG/ACT IN AERS: 1.0000 | INHALATION_SPRAY | Freq: Four times a day (QID) | RESPIRATORY_TRACT | Status: DC | PRN

## 2012-09-04 NOTE — ED Notes (Signed)
Pt c/o cold sxs onset 3 days... sxs include: ST, cough w/yellow phlegm, bilateral ear pain, back pain, wheezing... Hx of asthma... Taking her asthma meds she does not know the name of...  Denies: CP, SOB, f/v/n/d... She is alert and oriented w/no signs of acute distress.

## 2012-09-04 NOTE — ED Provider Notes (Signed)
Chief Complaint:   Chief Complaint  Patient presents with  . URI    History of Present Illness:   Denise Sampson is a 37 year old female with asthma who has had a three-day history of sore throat, cough productive yellow sputum, wheezing, ear pain, and upper back pain. She denies any fever or chills. No nasal congestion or drainage. She denies any GI symptoms. She was hospitalized for asthma back in March. Never on a ventilator. She has not be or on her at home but has run out of this. No prior history of pneumonia. She did not have a chest x-ray when she was hospitalized for the asthma in March of this year. Her last chest x-ray was 2 years ago and it showed interstitial pneumonitis. The patient speaks some English although not for much. Her 2 sons who are with her who spoke fluent [place], but one of the nursing personnel served as a facility interpreter.  Review of Systems:  Other than noted above, the patient denies any of the following symptoms: Systemic:  No fevers, chills, sweats, weight loss or gain, fatigue, or tiredness. Eye:  No redness or discharge. ENT:  No ear pain, drainage, headache, nasal congestion, drainage, sinus pressure, difficulty swallowing, or sore throat. Neck:  No neck pain or swollen glands. Lungs:  No cough, sputum production, hemoptysis, wheezing, chest tightness, shortness of breath or chest pain. GI:  No abdominal pain, nausea, vomiting or diarrhea.  PMFSH:  Past medical history, family history, social history, meds, and allergies were reviewed.   Physical Exam:   Vital signs:  BP 128/85  Pulse 90  Temp(Src) 99.5 F (37.5 C) (Oral)  Resp 20  SpO2 99% General:  Alert and oriented.  In no distress.  Skin warm and dry. Eye:  No conjunctival injection or drainage. Lids were normal. ENT:  TMs and canals were normal, without erythema or inflammation.  Nasal mucosa was clear and uncongested, without drainage.  Mucous membranes were moist.  Pharynx was  clear with no exudate or drainage.  There were no oral ulcerations or lesions. Neck:  Supple, no adenopathy, tenderness or mass. Lungs:  No respiratory distress.  She has bilateral expiratory wheezes both anteriorly and posteriorly with good air movement no rales or rhonchi.  Heart:  Regular rhythm, without gallops, murmers or rubs. Skin:  Clear, warm, and dry, without rash or lesions.  Radiology:  Dg Chest 2 View  09/04/2012   *RADIOLOGY REPORT*  Clinical Data: 3-day history of productive cough and wheezing. Current history of asthma.  CHEST - 2 VIEW  Comparison: Two-view chest x-ray 12/13/2010.  Findings: Airspace consolidation in the right middle lobe. Moderate central peribronchial thickening, similar to the prior examination.  Lungs otherwise clear.  No pleural effusions. Cardiomediastinal silhouette unremarkable, unchanged.  Visualized bony thorax intact.  IMPRESSION: Right middle lobe pneumonia superimposed upon moderate changes of chronic bronchitis and/or asthma.   Original Report Authenticated By: Hulan Saas, M.D.      Course in Urgent Care Center:   She was given a DuoNeb breathing treatment. She was not given any oral or IM steroids that she has pneumonia. She was given Rocephin 1 g IM.  Assessment:  The primary encounter diagnosis was Community acquired pneumonia. A diagnosis of Asthma, mild intermittent, with acute exacerbation was also pertinent to this visit.  She has both pneumonia and asthma. She will return again in 2 days for a recheck.  Plan:   1.  The following meds were prescribed:   New  Prescriptions   ALBUTEROL (PROVENTIL HFA;VENTOLIN HFA) 108 (90 BASE) MCG/ACT INHALER    Inhale 1-2 puffs into the lungs every 6 (six) hours as needed for wheezing.   AZITHROMYCIN (ZITHROMAX Z-PAK) 250 MG TABLET    Take as directed.   BENZONATATE (TESSALON) 200 MG CAPSULE    Take 1 capsule (200 mg total) by mouth 3 (three) times daily as needed for cough.   2.  The patient was  instructed in symptomatic care and handouts were given. 3.  The patient was told to return if becoming worse in any way, for a scheduled followup in 48 hours, and given some red flag symptoms such as increasing difficulty breathing or increasing fever that would indicate earlier return. 4.  Follow up here in 2 days.      Reuben Likes, MD 09/04/12 2040

## 2013-02-11 ENCOUNTER — Ambulatory Visit: Payer: Self-pay | Attending: Internal Medicine | Admitting: Internal Medicine

## 2013-02-25 ENCOUNTER — Emergency Department (INDEPENDENT_AMBULATORY_CARE_PROVIDER_SITE_OTHER): Payer: Self-pay

## 2013-02-25 ENCOUNTER — Encounter (HOSPITAL_COMMUNITY): Payer: Self-pay | Admitting: Emergency Medicine

## 2013-02-25 ENCOUNTER — Emergency Department (INDEPENDENT_AMBULATORY_CARE_PROVIDER_SITE_OTHER)
Admission: EM | Admit: 2013-02-25 | Discharge: 2013-02-25 | Disposition: A | Discharge status: Home / Self Care | Payer: Self-pay | Source: Home / Self Care | Attending: Family Medicine | Admitting: Family Medicine

## 2013-02-25 DIAGNOSIS — J111 Influenza due to unidentified influenza virus with other respiratory manifestations: Secondary | ICD-10-CM

## 2013-02-25 LAB — POCT RAPID STREP A: Streptococcus, Group A Screen (Direct): NEGATIVE

## 2013-02-25 MED ORDER — HYDROCOD POLST-CHLORPHEN POLST 10-8 MG/5ML PO LQCR: 5.0000 mL | Freq: Two times a day (BID) | ORAL | Status: DC | PRN

## 2013-02-25 MED ORDER — ALBUTEROL SULFATE (5 MG/ML) 0.5% IN NEBU
INHALATION_SOLUTION | RESPIRATORY_TRACT | Status: AC
  Filled 2013-02-25: qty 1

## 2013-02-25 MED ORDER — ALBUTEROL SULFATE (5 MG/ML) 0.5% IN NEBU
5.0000 mg | INHALATION_SOLUTION | Freq: Once | RESPIRATORY_TRACT | Status: AC
  Administered 2013-02-25: 5 mg via RESPIRATORY_TRACT

## 2013-02-25 MED ORDER — IPRATROPIUM BROMIDE 0.02 % IN SOLN
RESPIRATORY_TRACT | Status: AC
  Filled 2013-02-25: qty 2.5

## 2013-02-25 MED ORDER — IPRATROPIUM BROMIDE 0.02 % IN SOLN
0.5000 mg | Freq: Once | RESPIRATORY_TRACT | Status: AC
  Administered 2013-02-25: 0.5 mg via RESPIRATORY_TRACT

## 2013-02-25 MED ORDER — ACETAMINOPHEN 325 MG PO TABS
650.0000 mg | ORAL_TABLET | Freq: Once | ORAL | Status: AC
  Administered 2013-02-25: 650 mg via ORAL

## 2013-02-25 MED ORDER — ALBUTEROL SULFATE HFA 108 (90 BASE) MCG/ACT IN AERS: 1.0000 | INHALATION_SPRAY | Freq: Four times a day (QID) | RESPIRATORY_TRACT | Status: DC | PRN

## 2013-02-25 MED ORDER — ACETAMINOPHEN 325 MG PO TABS
ORAL_TABLET | ORAL | Status: AC
  Filled 2013-02-25: qty 2

## 2013-02-25 NOTE — ED Notes (Signed)
Not ready for discharge, breathing treatment in progress

## 2013-02-25 NOTE — ED Notes (Signed)
Fever, history of asthma.  Seen by dr Artis Flock prior to this nurse

## 2013-02-25 NOTE — ED Provider Notes (Signed)
CSN: [id number]     Arrival date & time 02/25/13  1613 History   First MD Initiated Contact with Patient 02/25/13 1647     Chief Complaint  Patient presents with  . Fever   (Consider location/radiation/quality/duration/timing/severity/associated sxs/prior Treatment) Patient is a 37 y.o. female presenting with cough. The history is provided by the patient.  Cough Cough characteristics:  Dry and non-productive Severity:  Moderate Duration:  3 days Progression:  Worsening Chronicity:  New Smoker: no   Context: upper respiratory infection   Relieved by:  Nothing Associated symptoms: chills, fever, myalgias and wheezing     Past Medical History  Diagnosis Date  . Asthma    Past Surgical History  Procedure Laterality Date  . Knee surgery    . Breast surgery     No family history on file. History  Substance Use Topics  . Smoking status: Never Smoker   . Smokeless tobacco: Not on file  . Alcohol Use: No   OB History   Grav Para Term Preterm Abortions TAB SAB Ect Mult Living                 Review of Systems  Constitutional: Positive for fever and chills.  Respiratory: Positive for cough and wheezing.   Musculoskeletal: Positive for myalgias.    Allergies  Review of patient's allergies indicates no known allergies.  Home Medications   Current Outpatient Rx  Name  Route  Sig  Dispense  Refill  . ibuprofen (ADVIL,MOTRIN) 200 MG tablet   Oral   Take 200 mg by mouth every 6 (six) hours as needed.         Marland Kitchen albuterol (PROVENTIL HFA;VENTOLIN HFA) 108 (90 BASE) MCG/ACT inhaler   Inhalation   Inhale 2 puffs into the lungs every 6 (six) hours as needed for wheezing.   1 Inhaler   2   . albuterol (PROVENTIL HFA;VENTOLIN HFA) 108 (90 BASE) MCG/ACT inhaler   Inhalation   Inhale 1-2 puffs into the lungs every 6 (six) hours as needed for wheezing.   1 Inhaler   0   . azithromycin (ZITHROMAX Z-PAK) 250 MG tablet      Take as directed.   6 tablet   0   .  benzonatate (TESSALON) 200 MG capsule   Oral   Take 1 capsule (200 mg total) by mouth 3 (three) times daily as needed for cough.   30 capsule   0   . chlorpheniramine-HYDROcodone (TUSSIONEX PENNKINETIC ER) 10-8 MG/5ML LQCR   Oral   Take 5 mLs by mouth every 12 (twelve) hours as needed for cough.   115 mL   0   . guaiFENesin-dextromethorphan (ROBITUSSIN DM) 100-10 MG/5ML syrup   Oral   Take 5 mLs by mouth every 4 (four) hours as needed for cough.   118 mL   0   . phentermine (ADIPEX-P) 37.5 MG tablet   Oral   Take 37.5 mg by mouth daily before breakfast.         . predniSONE (DELTASONE) 10 MG tablet      Prednisone 40 mg po daily x 1 day then Prednisone 30 mg po daily x 1 day then Prednisone 20 mg po daily x 1 day then Prednisone 10 mg daily x 1 day then stop...   40 tablet   0    BP 126/75  Pulse 122  Temp(Src) 102.5 F (39.2 C) (Oral)  Resp 24  SpO2 94%  LMP 02/13/2013 Physical Exam  Nursing note  and vitals reviewed. Constitutional: She appears well-developed and well-nourished.  HENT:  Head: Normocephalic.  Right Ear: External ear normal.  Left Ear: External ear normal.  Mouth/Throat: Oropharynx is clear and moist.  Eyes: Conjunctivae are normal. Pupils are equal, round, and reactive to light.  Neck: Normal range of motion. Neck supple.  Pulmonary/Chest: She has wheezes in the left middle field and the left lower field. She has rales.  Lymphadenopathy:    She has no cervical adenopathy.    ED Course  Procedures (including critical care time) Labs Review Labs Reviewed  POCT RAPID STREP A ([facility] ONLY)   Imaging Review Dg Chest 2 View  02/25/2013   CLINICAL DATA:  Cough  EXAM: CHEST  2 VIEW  COMPARISON:  09/04/2012  FINDINGS: The heart size and mediastinal contours are within normal limits. Both lungs are clear. The visualized skeletal structures are unremarkable.  IMPRESSION: No active cardiopulmonary disease.   Electronically Signed   By: Elige Ko   On: 02/25/2013 17:42    EKG Interpretation    Date/Time:    Ventricular Rate:    PR Interval:    QRS Duration:   QT Interval:    QTC Calculation:   R Axis:     Text Interpretation:              MDM      Linna Hoff, MD 02/25/13 1753

## 2013-02-27 LAB — CULTURE, GROUP A STREP

## 2013-11-04 ENCOUNTER — Emergency Department (HOSPITAL_COMMUNITY): Payer: Self-pay

## 2013-11-04 ENCOUNTER — Emergency Department (HOSPITAL_COMMUNITY)
Admission: EM | Admit: 2013-11-04 | Discharge: 2013-11-04 | Disposition: A | Discharge status: Home / Self Care | Payer: Self-pay | Attending: Emergency Medicine | Admitting: Emergency Medicine

## 2013-11-04 ENCOUNTER — Encounter (HOSPITAL_COMMUNITY): Payer: Self-pay | Admitting: Emergency Medicine

## 2013-11-04 DIAGNOSIS — J45901 Unspecified asthma with (acute) exacerbation: Secondary | ICD-10-CM | POA: Insufficient documentation

## 2013-11-04 DIAGNOSIS — Z7982 Long term (current) use of aspirin: Secondary | ICD-10-CM | POA: Insufficient documentation

## 2013-11-04 DIAGNOSIS — R079 Chest pain, unspecified: Secondary | ICD-10-CM | POA: Insufficient documentation

## 2013-11-04 DIAGNOSIS — E669 Obesity, unspecified: Secondary | ICD-10-CM | POA: Insufficient documentation

## 2013-11-04 LAB — I-STAT TROPONIN, ED: TROPONIN I, POC: 0 ng/mL (ref 0.00–0.08)

## 2013-11-04 LAB — BASIC METABOLIC PANEL
ANION GAP: 16 — AB (ref 5–15)
BUN: 11 mg/dL (ref 6–23)
CALCIUM: 8.7 mg/dL (ref 8.4–10.5)
CO2: 21 meq/L (ref 19–32)
Chloride: 101 mEq/L (ref 96–112)
Creatinine, Ser: 0.58 mg/dL (ref 0.50–1.10)
GFR calc non Af Amer: >90 mL/min (ref >90)
Glucose, Bld: 109 mg/dL — ABNORMAL HIGH (ref 70–99)
Potassium: 3.5 mEq/L — ABNORMAL LOW (ref 3.7–5.3)
SODIUM: 138 meq/L (ref 137–147)

## 2013-11-04 LAB — CBC
HCT: 42.7 % (ref 36.0–46.0)
Hemoglobin: 14.9 g/dL (ref 12.0–15.0)
MCH: 30.8 pg (ref 26.0–34.0)
MCHC: 34.9 g/dL (ref 30.0–36.0)
MCV: 88.4 fL (ref 78.0–100.0)
PLATELETS: 362 10*3/uL (ref 150–400)
RBC: 4.83 MIL/uL (ref 3.87–5.11)
RDW: 12.8 % (ref 11.5–15.5)
WBC: 14 10*3/uL — ABNORMAL HIGH (ref 4.0–10.5)

## 2013-11-04 MED ORDER — NITROGLYCERIN 0.4 MG SL SUBL
0.4000 mg | SUBLINGUAL_TABLET | SUBLINGUAL | Status: DC | PRN
  Administered 2013-11-04 (×2): 0.4 mg via SUBLINGUAL

## 2013-11-04 MED ORDER — ALBUTEROL SULFATE (2.5 MG/3ML) 0.083% IN NEBU
5.0000 mg | INHALATION_SOLUTION | Freq: Once | RESPIRATORY_TRACT | Status: AC
  Administered 2013-11-04: 5 mg via RESPIRATORY_TRACT
  Filled 2013-11-04: qty 6

## 2013-11-04 MED ORDER — ALBUTEROL SULFATE HFA 108 (90 BASE) MCG/ACT IN AERS: 2.0000 | INHALATION_SPRAY | Freq: Once | RESPIRATORY_TRACT | Status: DC

## 2013-11-04 MED ORDER — PREDNISONE 20 MG PO TABS
40.0000 mg | ORAL_TABLET | Freq: Once | ORAL | Status: AC
  Administered 2013-11-04: 40 mg via ORAL
  Filled 2013-11-04: qty 2

## 2013-11-04 MED ORDER — NITROGLYCERIN 0.4 MG SL SUBL
SUBLINGUAL_TABLET | SUBLINGUAL | Status: AC
  Filled 2013-11-04: qty 3

## 2013-11-04 MED ORDER — PREDNISONE 20 MG PO TABS: 40.0000 mg | ORAL_TABLET | Freq: Every day | ORAL | Status: DC

## 2013-11-04 MED ORDER — PREDNISONE 20 MG PO TABS: 40.0000 mg | ORAL_TABLET | Freq: Once | ORAL | Status: DC

## 2013-11-04 NOTE — Discharge Instructions (Signed)
Please call your doctor for a followup appointment within 24-48 hours. When you talk to your doctor please let them know that you were seen in the emergency department and have them acquire all of your records so that they can discuss the findings with you and formulate a treatment plan to fully care for your new and ongoing problems. ° °

## 2013-11-04 NOTE — ED Provider Notes (Signed)
CSN: [id number]     Arrival date & time 11/04/13  0110 History   First MD Initiated Contact with Patient 11/04/13 [phone]     Chief Complaint  Patient presents with  . Chest Pain     (Consider location/radiation/quality/duration/timing/severity/associated sxs/prior Treatment) HPI Comments: 38 year old female, very obese, prior history of reactive airway disease who presents with a feeling of tightness in her chest with some shortness of breath or cough as well as having it vague feeling of numbness to her left palm and she makes a fist. The symptoms have been persistent throughout the evening, nothing makes it better or worse, no associated swelling and she denies any risk factors for pulmonary embolism. She had similar symptoms one week ago, she denies any exertional chest pain as she does climb stairs at work though she does not exercise.  Patient is a 38 y.o. female presenting with chest pain. The history is provided by the patient.  Chest Pain   Past Medical History  Diagnosis Date  . Asthma    Past Surgical History  Procedure Laterality Date  . Knee surgery    . Breast surgery     No family history on file. History  Substance Use Topics  . Smoking status: Never Smoker   . Smokeless tobacco: Not on file  . Alcohol Use: No   OB History   Grav Para Term Preterm Abortions TAB SAB Ect Mult Living                 Review of Systems  Cardiovascular: Positive for chest pain.  All other systems reviewed and are negative.     Allergies  Review of patient's allergies indicates no known allergies.  Home Medications   Prior to Admission medications   Medication Sig Start Date End Date Taking? Authorizing Provider  aspirin EC 81 MG tablet Take 81 mg by mouth daily.   Yes Historical Provider, MD  predniSONE (DELTASONE) 20 MG tablet Take 2 tablets (40 mg total) by mouth daily. 11/04/13   Vida Roller, MD   BP 106/66  Pulse 88  Temp(Src) 98.7 F (37.1 C) (Oral)  Resp 17   SpO2 95%  LMP 10/09/2013 Physical Exam  Nursing note and vitals reviewed. Constitutional: She appears well-developed and well-nourished. No distress.  HENT:  Head: Normocephalic and atraumatic.  Mouth/Throat: Oropharynx is clear and moist. No oropharyngeal exudate.  Eyes: Conjunctivae and EOM are normal. Pupils are equal, round, and reactive to light. Right eye exhibits no discharge. Left eye exhibits no discharge. No scleral icterus.  Neck: Normal range of motion. Neck supple. No JVD present. No thyromegaly present.  Cardiovascular: Normal rate, regular rhythm, normal heart sounds and intact distal pulses.  Exam reveals no gallop and no friction rub.   No murmur heard. Pulmonary/Chest: Effort normal. No respiratory distress. She has wheezes. She has no rales.  Abdominal: Soft. Bowel sounds are normal. She exhibits no distension and no mass. There is no tenderness.  Musculoskeletal: Normal range of motion. She exhibits no edema and no tenderness.  Lymphadenopathy:    She has no cervical adenopathy.  Neurological: She is alert. Coordination normal.  Skin: Skin is warm and dry. No rash noted. No erythema.  Psychiatric: She has a normal mood and affect. Her behavior is normal.    ED Course  Procedures (including critical care time) Labs Review Labs Reviewed  CBC - Abnormal; Notable for the following:    WBC 14.0 (*)    All other components within  normal limits  BASIC METABOLIC PANEL - Abnormal; Notable for the following:    Potassium 3.5 (*)    Glucose, Bld 109 (*)    Anion gap 16 (*)    All other components within normal limits  Rosezena Sensor, ED    Imaging Review Dg Chest 2 View  11/04/2013   CLINICAL DATA:  Left-sided chest pain.  History of asthma.  EXAM: CHEST  2 VIEW  COMPARISON:  02/25/2013  FINDINGS: The heart size and mediastinal contours are within normal limits. Both lungs are clear. The visualized skeletal structures are unremarkable.  IMPRESSION: No active  cardiopulmonary disease.   Electronically Signed   By: Burman Nieves M.D.   On: 11/04/2013 02:04    ED ECG REPORT  I personally interpreted this EKG   Date: 11/04/2013   Rate: 83  Rhythm: normal sinus rhythm  QRS Axis: normal  Intervals: normal  ST/T Wave abnormalities: normal  Conduction Disutrbances:none  Narrative Interpretation:   Old EKG Reviewed: none available   MDM   Final diagnoses:  Asthma attack  Chest pain, unspecified chest pain type    The patient has vital signs consistent with mild hypertension, mild hypoxia at 94%, this is consistent with reactive airway disease flare, albuterol will be ordered, chest x-ray shows no signs of infiltrate, troponin is normal as ordered by nursing. Doubt cardiac disease as the symptoms did start at rest while she was laying down tonight. She has otherwise 0 risk factors for heart disease.  Pt improved after neb - minimal wheezing now and no CP or hand symptoms.  Reviewed testing wtihout acute findings.  Meds given in ED:  Medications  nitroGLYCERIN (NITROSTAT) SL tablet 0.4 mg (0.4 mg Sublingual Given 11/04/13 0214)  nitroGLYCERIN (NITROSTAT) 0.4 MG SL tablet (not administered)  albuterol (PROVENTIL HFA;VENTOLIN HFA) 108 (90 BASE) MCG/ACT inhaler 2 puff (not administered)  predniSONE (DELTASONE) tablet 40 mg (not administered)  albuterol (PROVENTIL) (2.5 MG/3ML) 0.083% nebulizer solution 5 mg (5 mg Nebulization Given 11/04/13 0305)  predniSONE (DELTASONE) tablet 40 mg (40 mg Oral Given 11/04/13 0304)    New Prescriptions   PREDNISONE (DELTASONE) 20 MG TABLET    Take 2 tablets (40 mg total) by mouth daily.      Vida Roller, MD 11/04/13 [phone]

## 2013-11-04 NOTE — ED Notes (Signed)
Patient reports already taking aspirin today

## 2013-11-04 NOTE — ED Notes (Signed)
Pt reports sudden onset of L hand/arm numbness radiating into L chest pressure. reports this happened last week too but it went away on its own. Pt also reports nausea. No cardiac hx. Pt took  1 baby Asprin pta.

## 2013-11-04 NOTE — ED Notes (Signed)
Phlebotomy at the bedside  

## 2015-03-30 ENCOUNTER — Ambulatory Visit: Payer: Self-pay | Admitting: Family Medicine

## 2015-06-05 ENCOUNTER — Encounter (HOSPITAL_COMMUNITY): Payer: Self-pay | Admitting: Emergency Medicine

## 2015-06-05 ENCOUNTER — Emergency Department (INDEPENDENT_AMBULATORY_CARE_PROVIDER_SITE_OTHER)
Admission: EM | Admit: 2015-06-05 | Discharge: 2015-06-05 | Disposition: A | Discharge status: Home / Self Care | Payer: Self-pay | Source: Home / Self Care | Attending: Family Medicine | Admitting: Family Medicine

## 2015-06-05 DIAGNOSIS — J4541 Moderate persistent asthma with (acute) exacerbation: Secondary | ICD-10-CM

## 2015-06-05 MED ORDER — PREDNISONE 20 MG PO TABS: ORAL_TABLET | ORAL | Status: DC

## 2015-06-05 MED ORDER — ALBUTEROL SULFATE (2.5 MG/3ML) 0.083% IN NEBU
INHALATION_SOLUTION | RESPIRATORY_TRACT | Status: AC
  Filled 2015-06-05: qty 3

## 2015-06-05 MED ORDER — METHYLPREDNISOLONE ACETATE 80 MG/ML IJ SUSP
80.0000 mg | Freq: Once | INTRAMUSCULAR | Status: AC
  Administered 2015-06-05: 80 mg via INTRAMUSCULAR

## 2015-06-05 MED ORDER — IPRATROPIUM-ALBUTEROL 0.5-2.5 (3) MG/3ML IN SOLN
3.0000 mL | Freq: Once | RESPIRATORY_TRACT | Status: AC
  Administered 2015-06-05: 3 mL via RESPIRATORY_TRACT

## 2015-06-05 MED ORDER — IPRATROPIUM-ALBUTEROL 0.5-2.5 (3) MG/3ML IN SOLN
RESPIRATORY_TRACT | Status: AC
  Filled 2015-06-05: qty 3

## 2015-06-05 MED ORDER — METHYLPREDNISOLONE ACETATE 80 MG/ML IJ SUSP
INTRAMUSCULAR | Status: AC
  Filled 2015-06-05: qty 1

## 2015-06-05 MED ORDER — ALBUTEROL SULFATE (2.5 MG/3ML) 0.083% IN NEBU
2.5000 mg | INHALATION_SOLUTION | Freq: Once | RESPIRATORY_TRACT | Status: AC
  Administered 2015-06-05: 2.5 mg via RESPIRATORY_TRACT

## 2015-06-05 NOTE — Discharge Instructions (Signed)
Asma, broncoespasmo agudo (Asthma, Acute Bronchospasm) Use your inhaler every 4 hours or your albuterol nebulizer every 4 hours as needed for wheezing. Start taking your prednisone tablets tomorrow. Take with food. You need to establish with a physician as soon as possible. Read these instructions to locate the phone number and resources for obtaining a physician. El broncoespasmo agudo causado por el asma tambin se conoce como crisis de asma. Broncoespasmo significa que las vas respiratorias se han estrechado. La causa del estrechamiento es la inflamacin y la constriccin de los msculos de las vas respiratorias (bronquios) que se encuentran en los pulmones. Esto puede dificultar la respiracin o provocarle sibilancias y tos. [place] desencadenantes posibles son:  La caspa que eliminan los animales de la piel, el pelo o las plumas de [place].  Los caros que se encuentran en el polvo de la casa.  [place].  El polen de los rboles o el csped.  [place].  El humo del cigarrillo o del tabaco  Sustancias contaminantes como el polvo, limpiadores hogareos, aerosoles (como los [place] para el cabello), vapores de pintura, sustancias qumicas fuertes u olores intensos.  El aire fro o cambios climticos. El aire fro puede causar inflamacin. El viento aumenta la cantidad de moho y polen del aire.  Emociones fuertes, Engineer, production o rer intensamente.  Estrs.  Ciertos medicamentos como la aspirina o betabloqueantes.  Los sulfitos que se encuentran en las comidas y bebidas como frutas secas y el vino.  Enfermedades infecciosas o inflamatorias, como la gripe, el resfro o la inflamacin de las membranas nasales (rinitis).  El reflujo gastroesofgico (ERGE). El reflujo gastroesofgico es una afeccin en la que los cidos estomacales vuelven al esfago.  Los ejercicios o actividades extenuantes. [place].  Tos intensa, especialmente por la  noche.  Opresin en el pecho.  Falta de aire. DIAGNSTICO  El mdico le har una historia clnica y le har un examen fsico. [place] Comment indicarn radiografas o anlisis de sangre para buscar otras causas de los sntomas u otras enfermedades que puedan desencadenar una crisis de asma.  TRATAMIENTO  El tratamiento est dirigido a reducir la inflamacin y [place] vas respiratorias en los pulmones. La mayor parte de las crisis asmticas se tratan con medicamentos por va inhalatoria. Entre ellos se incluyen los medicamentos de alivio rpido o medicamentos de rescate (como los broncodilatadores) y los medicamentos de control (como los corticoides inhalados). Estos medicamentos se administran a travs de Educational psychologist o de un nebulizador. Los corticoides sistmicos por va oral o por va intravenosa tambin se administran para reducir la inflamacin cuando un ataque es moderado o grave. Los antibiticos se indican solo si hay infeccin bacteriana.  INSTRUCCIONES PARA EL CUIDADO EN EL HOGAR   Reposo.  Beba lquido en abundancia. Esto ayuda a diluir la mucosidad y a Transport planner. Solo consuma productos con cafena moderadamente y no consuma alcohol hasta que se haya recuperado de la enfermedad.  No fume. Evite la exposicin al humo de otros fumadores.  Usted tiene un rol fundamental en mantener su buena salud. Evite la exposicin a lo que [facility] respiratorios.  Mantenga los medicamentos actualizados y al alcance. Siga cuidadosamente el plan de tratamiento del mdico.  Utilice los medicamentos tal como se le indic.  Cuando haya mucho polen o polucin, mantenga las ventanas cerradas y use el aire acondicionado o vaya a lugares con aire acondicionado.  El asma requiere atencin [place]. Concurra a los controles segn  las indicaciones. Si tiene Nutritional therapist de ms de 24 semanas y le han recetado medicamentos nuevos, comntelo con su obstetra y cul es su evolucin.  Concurra a las consultas de control con su mdico segn las indicaciones.  Despus de recuperarse de la crisis de asma, haga una cita con el mdico para conocer cmo puede reducir la probabilidad de futuros ataques. Si no cuenta con un mdico para que controle su asma, haga una cita con un mdico de atencin primaria para hablar de esta enfermedad. [place] DE INMEDIATO SI:   Empeora.  Tiene dificultad para respirar. Si la dificultad es intensa comunquese con el servicio de Multimedia programmer de su localidad (911 en los [place]).  Siente dolor o Adult nurse.  Tiene vmitos.  No puede retener los lquidos.  Elimina una expectoracin verde, amarilla, amarronada o sanguinolenta.  Tiene fiebre y los sntomas empeoran repentinamente.  Presenta dificultad para tragar. ASEGRESE DE QUE:   Comprende estas instrucciones.  Controlar su afeccin.  Recibir ayuda de inmediato si no mejora o si empeora.   Esta informacin no tiene Marine scientist el consejo del mdico. Asegrese de hacerle al mdico cualquier pregunta que tenga.   Document Released: 06/13/2008 Document Revised: 03/02/2013 Elsevier Interactive Patient Education [employer].

## 2015-06-05 NOTE — ED Provider Notes (Signed)
CSN: [id number]     Arrival date & time 06/05/15  1656 History   First MD Initiated Contact with Patient 06/05/15 1826     Chief Complaint  Patient presents with  . URI   (Consider location/radiation/quality/duration/timing/severity/associated sxs/prior Treatment) HPI Comments: 40 year old female complaining of cough for 2 days associated with nasal congestion and runny nose. She has a history of asthma but is out of her medications. Denies fever. She states she does not have a PCP.   Past Medical History  Diagnosis Date  . Asthma    Past Surgical History  Procedure Laterality Date  . Knee surgery    . Breast surgery     History reviewed. No pertinent family history. Social History  Substance Use Topics  . Smoking status: Never Smoker   . Smokeless tobacco: None  . Alcohol Use: No   OB History    No data available     Review of Systems  Constitutional: Positive for activity change. Negative for fever, chills, appetite change and fatigue.  HENT: Positive for congestion, postnasal drip and rhinorrhea. Negative for ear pain, facial swelling and sore throat.   Eyes: Negative.   Respiratory: Positive for cough, shortness of breath and wheezing.   Cardiovascular: Negative.   Gastrointestinal: Negative.   Genitourinary: Negative.   Musculoskeletal: Negative for neck pain and neck stiffness.  Skin: Negative for pallor and rash.  Neurological: Negative.     Allergies  Review of patient's allergies indicates no known allergies.  Home Medications   Prior to Admission medications   Medication Sig Start Date End Date Taking? Authorizing Provider  aspirin EC 81 MG tablet Take 81 mg by mouth daily.    Historical Provider, MD  predniSONE (DELTASONE) 20 MG tablet 3 Tabs PO Days 1-3, then 2 tabs PO Days 4-6, then 1 tab PO Day 7-9, then Half Tab PO Day 10-12 06/05/15   Hayden Rasmussen, NP   Meds Ordered and Administered this Visit   Medications  methylPREDNISolone acetate  (DEPO-MEDROL) injection 80 mg (80 mg Intramuscular Given 06/05/15 1903)  ipratropium-albuterol (DUONEB) 0.5-2.5 (3) MG/3ML nebulizer solution 3 mL (3 mLs Nebulization Given 06/05/15 1905)  albuterol (PROVENTIL) (2.5 MG/3ML) 0.083% nebulizer solution 2.5 mg (2.5 mg Nebulization Given 06/05/15 1905)    BP 109/81 mmHg  Pulse 95  Temp(Src) 98.4 F (36.9 C) (Oral)  Resp 18  SpO2 95% No data found.   Physical Exam  Constitutional: She appears well-developed and well-nourished. No distress.  HENT:  Mouth/Throat: No oropharyngeal exudate.  Bilateral TMs are normal Oropharynx with minor clear PND.  Eyes: Conjunctivae and EOM are normal.  Neck: Normal range of motion. Neck supple.  Cardiovascular: Normal rate, regular rhythm and normal heart sounds.   Pulmonary/Chest: Effort normal. No respiratory distress. She has wheezes.  Expiratory wheezes bilaterally. Prolonged expiratory phase.  Musculoskeletal: She exhibits no edema.  Lymphadenopathy:    She has no cervical adenopathy.  Neurological: She is alert. She exhibits normal muscle tone.  Skin: Skin is warm and dry.  Nursing note and vitals reviewed.   ED Course  Procedures (including critical care time)  Labs Review Labs Reviewed - No data to display  Imaging Review No results found.   Visual Acuity Review  Right Eye Distance:   Left Eye Distance:   Bilateral Distance:    Right Eye Near:   Left Eye Near:    Bilateral Near:         MDM   1. Asthma exacerbation attacks, moderate persistent  Use inhaler every 4 hours or your albuterol nebulizer every 4 hours as needed for wheezing. Start taking your prednisone tablets tomorrow. Take with food. You need to establish with a physician as soon as possible. Read these instructions to locate the phone number and resources for obtaining a physician.  Meds ordered this encounter  Medications  . methylPREDNISolone acetate (DEPO-MEDROL) injection 80 mg    Sig:   .  ipratropium-albuterol (DUONEB) 0.5-2.5 (3) MG/3ML nebulizer solution 3 mL    Sig:   . albuterol (PROVENTIL) (2.5 MG/3ML) 0.083% nebulizer solution 2.5 mg    Sig:   . predniSONE (DELTASONE) 20 MG tablet    Sig: 3 Tabs PO Days 1-3, then 2 tabs PO Days 4-6, then 1 tab PO Day 7-9, then Half Tab PO Day 10-12    Dispense:  20 tablet    Refill:  0    Order Specific Question:  Supervising Provider    Answer:  Linna HoffKINDL, JAMES D [phone][[id number]]   Status post nebulizer treatment dosages as above. There is improvement in air movement and decrease and wheezes. The wheezes have not completely abated. She still has a mild cough. She understands that she should be using her inhaler or nebulizer every 4 hours and start the prednisone tomorrow. She needs to obtain an establish with a physician as soon as possible.  Hayden Rasmussenavid Jomarie Gellis, NP 06/05/15 [phone]

## 2015-06-05 NOTE — ED Notes (Signed)
Pt has been suffering from upper respiratory symptoms for two days.  She denies any fever.

## 2015-07-06 DIAGNOSIS — Z139 Encounter for screening, unspecified: Secondary | ICD-10-CM

## 2015-07-06 NOTE — Congregational Nurse Program (Signed)
Congregational Nurse Program Note  Date of Encounter: 07/06/2015  Past Medical History: Past Medical History  Diagnosis Date  . Asthma     Encounter Details:     CNP Questionnaire - 07/06/15 1415    Patient Demographics   Is this a new or existing patient? New   Patient is considered a/an Immigrant   Race Latino/Hispanic   Patient Assistance   [place] [facility]   Patient's financial/insurance status [employer]   Uninsured Patient No   Patient referred to apply for the following financial assistance Not Applicable   Food insecurities addressed Not Applicable   Transportation assistance No   Assistance securing medications No   Educational health offerings Hypertension   Encounter Details   Primary purpose of visit Education/Health Concerns   Was an Emergency Department visit averted? Not Applicable   Does patient have a medical provider? Yes   Patient referred to Not Applicable   Was a mental health screening completed? (GAINS tool) No   Does patient have dental issues? No   Does patient have vision issues? No   Since previous encounter, have you referred patient for abnormal blood pressure that resulted in a new diagnosis or medication change? No   Since previous encounter, have you referred patient for abnormal blood glucose that resulted in a new diagnosis or medication change? No       BP 115/78 mmHg  Pulse 77 Fransisco BeauDebbie Garwood RN CNP [phone]

## 2015-11-13 IMAGING — CR DG CHEST 2V
2 series · 2 of 2 positions shown · non-contrast
Comparison: 02/25/2013

CLINICAL DATA: Left-sided chest pain.  History of asthma.

EXAM:
CHEST  2 VIEW

[w chest pa]
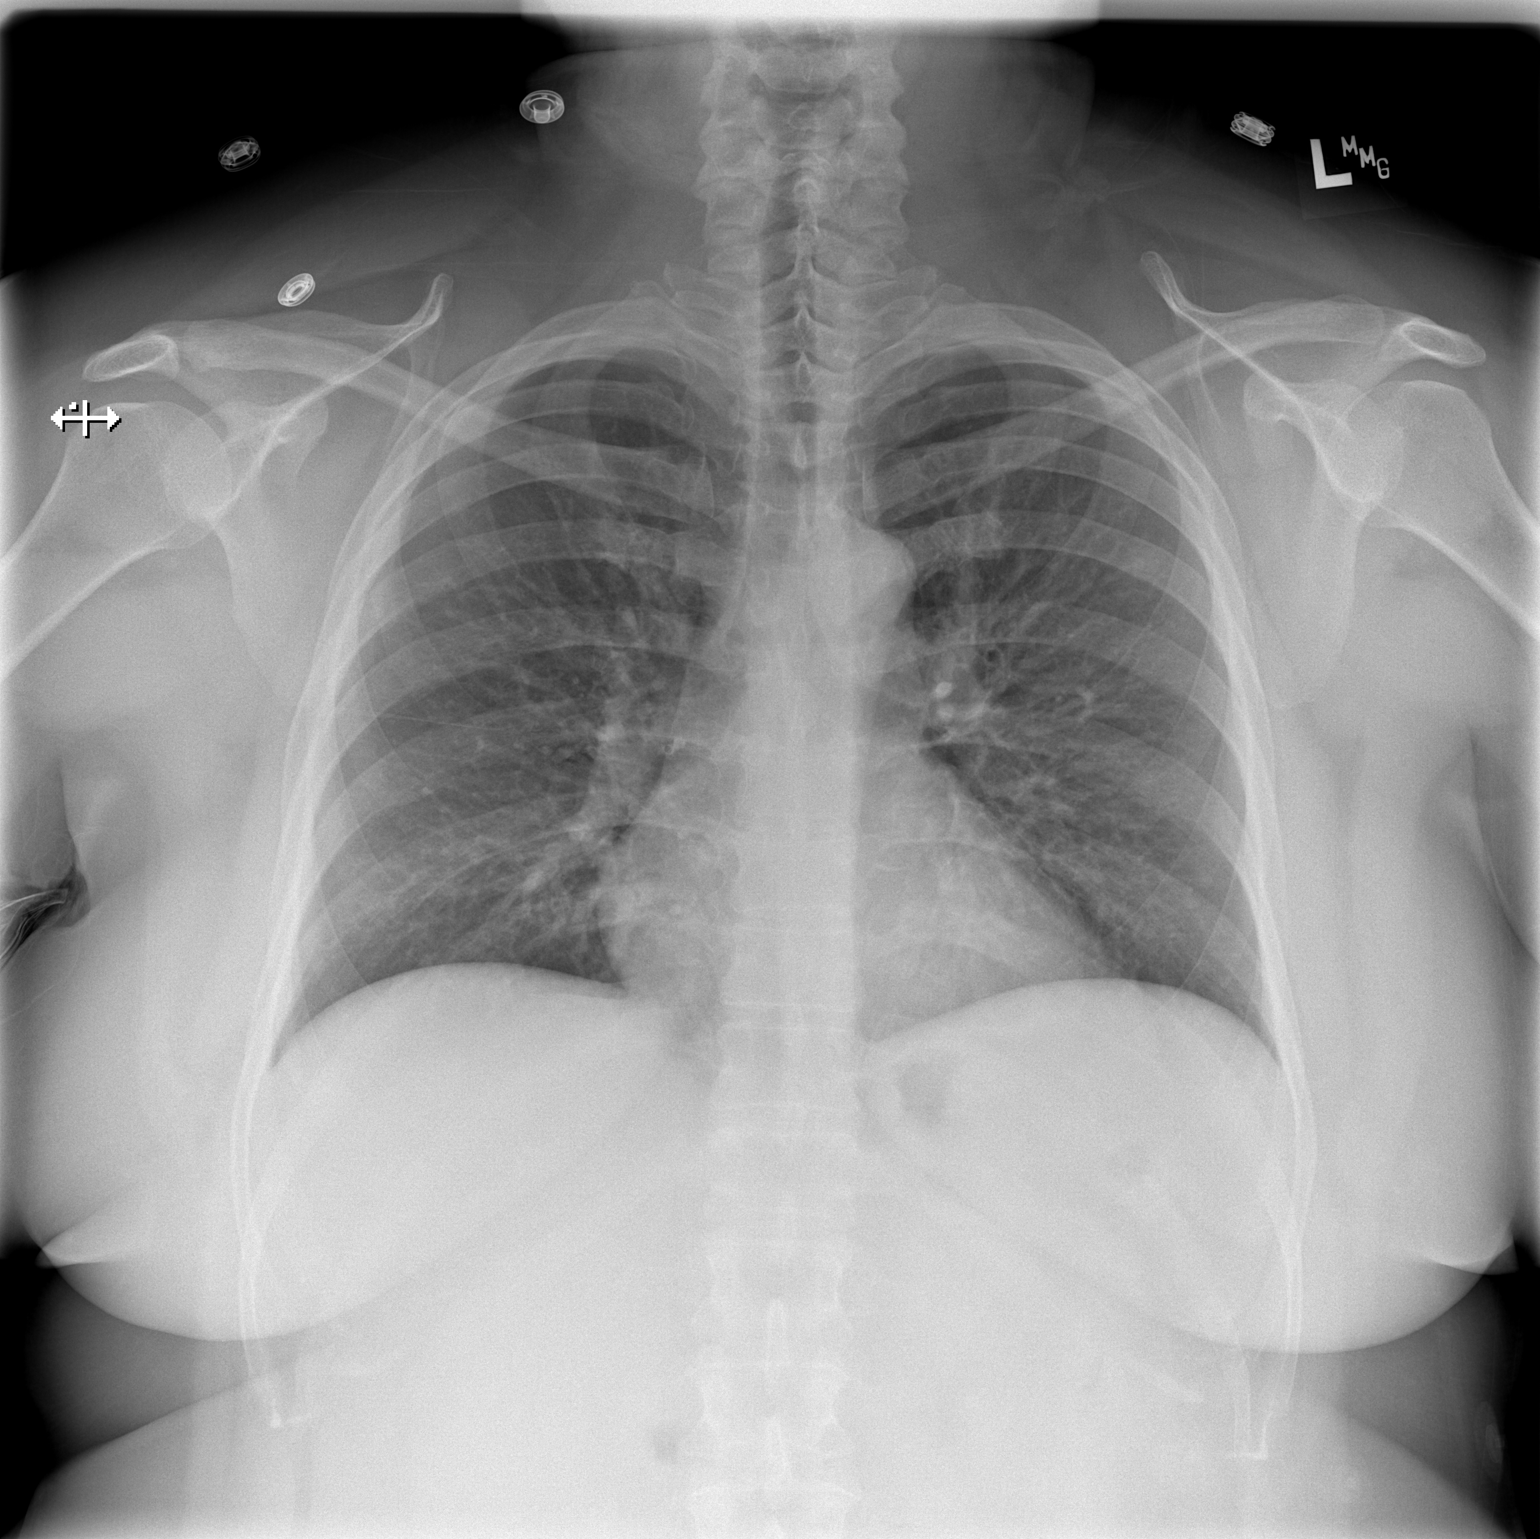

[w chest lat]
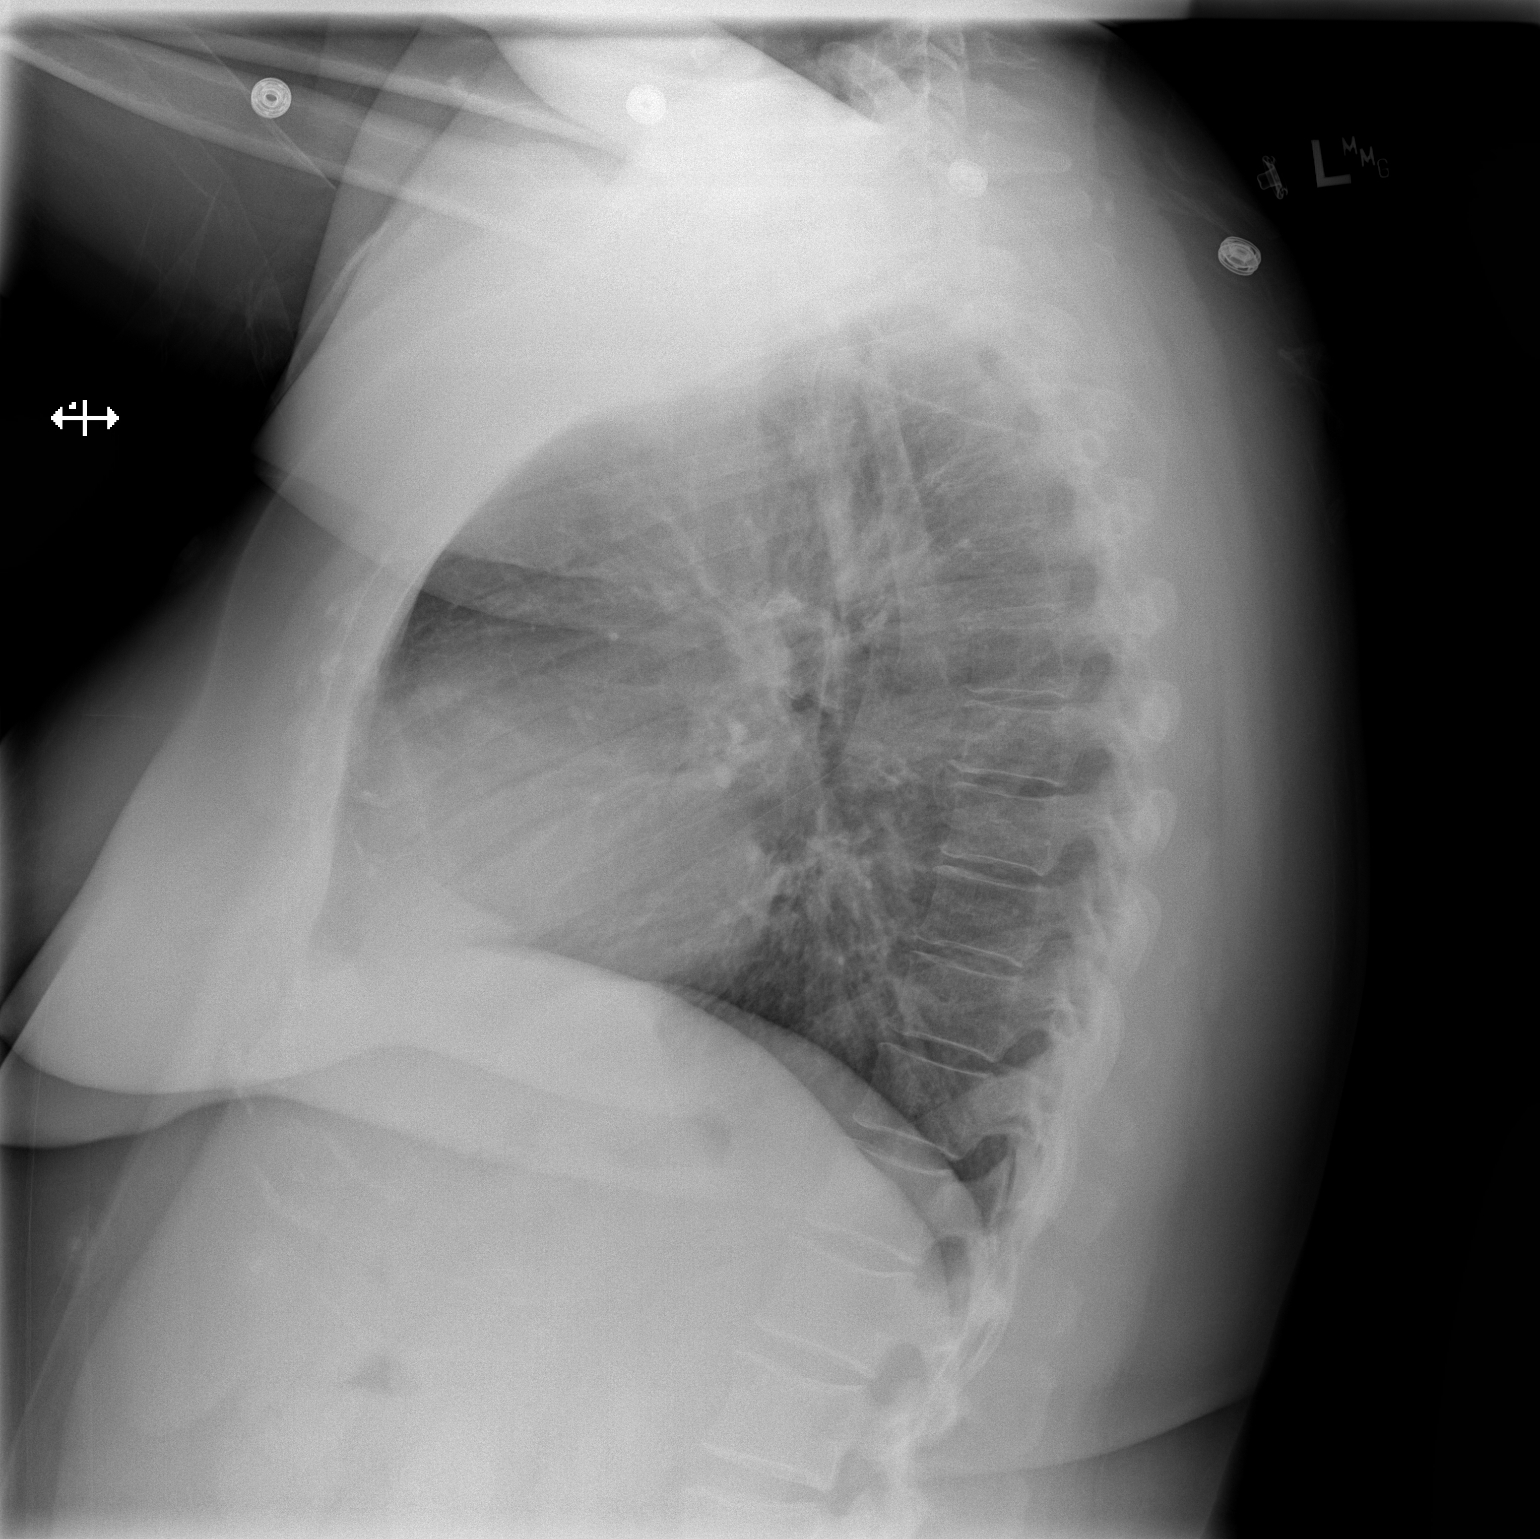

[2 of 2 positions shown; findings below may reference images not displayed]

FINDINGS: The heart size and mediastinal contours are within normal limits.
Both lungs are clear. The visualized skeletal structures are
unremarkable.
IMPRESSION: No active cardiopulmonary disease.

## 2018-02-09 ENCOUNTER — Other Ambulatory Visit: Payer: Self-pay

## 2018-02-09 ENCOUNTER — Encounter (HOSPITAL_COMMUNITY): Payer: Self-pay

## 2018-02-09 ENCOUNTER — Ambulatory Visit (HOSPITAL_COMMUNITY)
Admission: EM | Admit: 2018-02-09 | Discharge: 2018-02-09 | Disposition: A | Discharge status: Home / Self Care | Payer: Self-pay | Attending: Family Medicine | Admitting: Family Medicine

## 2018-02-09 DIAGNOSIS — J4521 Mild intermittent asthma with (acute) exacerbation: Secondary | ICD-10-CM

## 2018-02-09 MED ORDER — METHYLPREDNISOLONE SODIUM SUCC 125 MG IJ SOLR
80.0000 mg | Freq: Once | INTRAMUSCULAR | Status: AC
  Administered 2018-02-09: 80 mg via INTRAMUSCULAR

## 2018-02-09 MED ORDER — PREDNISONE 50 MG PO TABS: 50.0000 mg | ORAL_TABLET | Freq: Every day | ORAL | 0 refills | Status: DC

## 2018-02-09 MED ORDER — IPRATROPIUM-ALBUTEROL 0.5-2.5 (3) MG/3ML IN SOLN
3.0000 mL | Freq: Once | RESPIRATORY_TRACT | Status: AC
  Administered 2018-02-09: 3 mL via RESPIRATORY_TRACT

## 2018-02-09 MED ORDER — IPRATROPIUM-ALBUTEROL 0.5-2.5 (3) MG/3ML IN SOLN
RESPIRATORY_TRACT | Status: AC
  Filled 2018-02-09: qty 3

## 2018-02-09 MED ORDER — METHYLPREDNISOLONE SODIUM SUCC 125 MG IJ SOLR
INTRAMUSCULAR | Status: AC
  Filled 2018-02-09: qty 2

## 2018-02-09 NOTE — ED Provider Notes (Signed)
[facility]    CSN: [id number] Arrival date & time: 02/09/18  1832     History   Chief Complaint Chief Complaint  Patient presents with  . Cough    HPI Denise Sampson is a 42 y.o. female.   42 year old female with history of asthma comes in for 3 day history of URI symptoms. HPI obtained by patient through video translator. Productive cough, rhinorrhea, nasal congestion. Cough worse at night. Denies fever, chills, night sweats. Has had to use albuterol inhaler with some relief. Never smoker. No sick contact.      Past Medical History:  Diagnosis Date  . Asthma     Patient Active Problem List   Diagnosis Date Noted  . Asthma with acute exacerbation 06/01/2012    Past Surgical History:  Procedure Laterality Date  . BREAST SURGERY    . KNEE SURGERY      OB History   None      Home Medications    Prior to Admission medications   Medication Sig Start Date End Date Taking? Authorizing Provider  aspirin EC 81 MG tablet Take 81 mg by mouth daily.    [provider]  predniSONE (DELTASONE) 50 MG tablet Take 1 tablet (50 mg total) by mouth daily. 02/09/18   Belinda Fisher, PA-C    Family History History reviewed. No pertinent family history.  Social History Social History   Tobacco Use  . Smoking status: Never Smoker  . Smokeless tobacco: Never Used  Substance Use Topics  . Alcohol use: No  . Drug use: No     Allergies   Patient has no known allergies.   Review of Systems Review of Systems  Reason unable to perform ROS: See HPI as above.     Physical Exam Triage Vital Signs ED Triage Vitals [02/09/18 1939]  Enc Vitals Group     BP 134/87     Pulse Rate 78     Resp 18     Temp 98.3 F (36.8 C)     Temp src      SpO2 100 %     Weight 260 lb (117.9 kg)     Height      Head Circumference      Peak Flow      Pain Score      Pain Loc      Pain Edu?      Excl. in GC?    No data found.  Updated Vital  Signs BP 134/87 (BP Location: Left Arm)   Pulse 78   Temp 98.3 F (36.8 C)   Resp 18   Wt 260 lb (117.9 kg)   LMP 01/06/2018   SpO2 100%   BMI 50.78 kg/m   Physical Exam  Constitutional: She is oriented to person, place, and time. She appears well-developed and well-nourished. No distress.  HENT:  Head: Normocephalic and atraumatic.  Right Ear: Tympanic membrane, external ear and ear canal normal. Tympanic membrane is not erythematous and not bulging.  Left Ear: Tympanic membrane, external ear and ear canal normal. Tympanic membrane is not erythematous and not bulging.  Nose: Nose normal. Right sinus exhibits no maxillary sinus tenderness and no frontal sinus tenderness. Left sinus exhibits no maxillary sinus tenderness and no frontal sinus tenderness.  Mouth/Throat: Uvula is midline, oropharynx is clear and moist and mucous membranes are normal.  Eyes: Pupils are equal, round, and reactive to light. Conjunctivae are normal.  Neck: Normal range of motion.  Neck supple.  Cardiovascular: Normal rate, regular rhythm and normal heart sounds. Exam reveals no gallop and no friction rub.  No murmur heard. Pulmonary/Chest: Effort normal. No accessory muscle usage. No respiratory distress.  Speaking in full sentences without difficulty. Diffuse inspiratory and expiratory wheezing with decreased air movement. Right upper field rhonchi noted as well.   Lymphadenopathy:    She has no cervical adenopathy.  Neurological: She is alert and oriented to person, place, and time.  Skin: Skin is warm and dry. She is not diaphoretic.  Psychiatric: She has a normal mood and affect. Her behavior is normal. Judgment normal.     UC Treatments / Results  Labs (all labs ordered are listed, but only abnormal results are displayed) Labs Reviewed - No data to display  EKG None  Radiology No results found.  Procedures Procedures (including critical care time)  Medications Ordered in UC Medications   ipratropium-albuterol (DUONEB) 0.5-2.5 (3) MG/3ML nebulizer solution 3 mL (3 mLs Nebulization Given 02/09/18 2010)  methylPREDNISolone sodium succinate (SOLU-MEDROL) 125 mg/2 mL injection 80 mg (80 mg Intramuscular Given 02/09/18 2026)    Initial Impression / Assessment and Plan / UC Course  I have reviewed the triage vital signs and the nursing notes.  Pertinent labs & imaging results that were available during my care of the patient were reviewed by me and considered in my medical decision making (see chart for details).    Patient with much improved symptoms after DuoNeb treatment x1.  Lungs clear to auscultation bilaterally, resolved wheezing and rhonchi.  Much improved air movement.  Will provide Solu-Medrol injection in office today, start prednisone as directed.  Other symptomatic treatment discussed.  Push fluids.  Return precautions given.  Patient expresses understanding and agrees to plan.  Final Clinical Impressions(s) / UC Diagnoses   Final diagnoses:  Mild intermittent asthma with exacerbation    ED Prescriptions    Medication Sig Dispense Auth. Provider   predniSONE (DELTASONE) 50 MG tablet Take 1 tablet (50 mg total) by mouth daily. 5 tablet Threasa AlphaYu, Arnie Clingenpeel V, PA-C        Sumiya Mamaril V, [place]-C 02/09/18 2054

## 2018-02-09 NOTE — ED Triage Notes (Signed)
Pt cc  deep cough and its worst at night. Pt states she has had some fever. X 3 days

## 2018-02-09 NOTE — Discharge Instructions (Addendum)
Solu-Medrol given in office today.  Start prednisone as directed.  Continue albuterol inhaler for shortness of breath/wheezing. Can use over the counter flonase,  zyrtec-D for nasal congestion/drainage. You can use over the counter nasal saline rinse such as neti pot for nasal congestion. Keep hydrated, your urine should be clear to pale yellow in color. Tylenol/motrin for fever and pain. Monitor for any worsening of symptoms, chest pain, shortness of breath, wheezing, swelling of the throat, follow up for reevaluation.   For sore throat/cough try using a honey-based tea. Use 3 teaspoons of honey with juice squeezed from half lemon. Place shaved pieces of ginger into 1/2-1 cup of water and warm over stove top. Then mix the ingredients and repeat every 4 hours as needed.

## 2020-06-12 ENCOUNTER — Encounter (HOSPITAL_COMMUNITY): Payer: Self-pay

## 2020-06-12 ENCOUNTER — Ambulatory Visit (HOSPITAL_COMMUNITY)
Admission: EM | Admit: 2020-06-12 | Discharge: 2020-06-12 | Disposition: A | Discharge status: Home / Self Care | Payer: Self-pay | Attending: Student | Admitting: Student

## 2020-06-12 ENCOUNTER — Other Ambulatory Visit: Payer: Self-pay

## 2020-06-12 DIAGNOSIS — Z789 Other specified health status: Secondary | ICD-10-CM

## 2020-06-12 DIAGNOSIS — J4541 Moderate persistent asthma with (acute) exacerbation: Secondary | ICD-10-CM

## 2020-06-12 MED ORDER — METHYLPREDNISOLONE SODIUM SUCC 125 MG IJ SOLR
60.0000 mg | Freq: Once | INTRAMUSCULAR | Status: AC
  Administered 2020-06-12: 60 mg via INTRAMUSCULAR

## 2020-06-12 MED ORDER — ALBUTEROL SULFATE HFA 108 (90 BASE) MCG/ACT IN AERS
2.0000 | INHALATION_SPRAY | Freq: Once | RESPIRATORY_TRACT | Status: AC
  Administered 2020-06-12: 2 via RESPIRATORY_TRACT

## 2020-06-12 MED ORDER — ALBUTEROL SULFATE HFA 108 (90 BASE) MCG/ACT IN AERS
INHALATION_SPRAY | RESPIRATORY_TRACT | Status: AC
  Filled 2020-06-12: qty 6.7

## 2020-06-12 MED ORDER — METHYLPREDNISOLONE SODIUM SUCC 125 MG IJ SOLR
INTRAMUSCULAR | Status: AC
  Filled 2020-06-12: qty 2

## 2020-06-12 MED ORDER — PREDNISONE 20 MG PO TABS: 40.0000 mg | ORAL_TABLET | Freq: Every day | ORAL | 0 refills | Status: AC

## 2020-06-12 NOTE — ED Provider Notes (Addendum)
[facility]    CSN: [id number] Arrival date & time: 06/12/20  1849      History   Chief Complaint Chief Complaint  Patient presents with  . Otalgia  . Nasal Congestion  . Wheezing    HPI Denise Sampson is a 45 y.o. female presenting with bilateral ear pressure, nasal congestion, cough for 2 days.  Medical history of asthma.,  Which has been poorly controlled since running out of inhalers 2 days ago.  States that she typically uses albuterol inhaler as needed for asthma.  Endorses nonproductive cough, shortness of breath that is worse with exertion.  Requesting Solu-Medrol injection.  Denies fever/chills, chest pain, dizziness, hearing changes, nausea/vomiting/diarrhea.  HPI  Past Medical History:  Diagnosis Date  . Asthma     Patient Active Problem List   Diagnosis Date Noted  . Asthma with acute exacerbation 06/01/2012    Past Surgical History:  Procedure Laterality Date  . BREAST SURGERY    . KNEE SURGERY      OB History   No obstetric history on file.      Home Medications    Prior to Admission medications   Medication Sig Start Date End Date Taking? Authorizing Provider  predniSONE (DELTASONE) 20 MG tablet Take 2 tablets (40 mg total) by mouth daily for 5 days. 06/12/20 06/17/20 Yes Rhys Martini, PA-C  aspirin EC 81 MG tablet Take 81 mg by mouth daily.    [provider]    Family History History reviewed. No pertinent family history.  Social History Social History   Tobacco Use  . Smoking status: Never Smoker  . Smokeless tobacco: Never Used  Substance Use Topics  . Alcohol use: No  . Drug use: No     Allergies   Patient has no known allergies.   Review of Systems Review of Systems  Constitutional: Negative for appetite change, chills and fever.  HENT: Positive for congestion and ear pain. Negative for ear discharge, rhinorrhea, sinus pressure, sinus pain and sore throat.   Eyes: Negative for redness and  visual disturbance.  Respiratory: Positive for cough. Negative for chest tightness, shortness of breath and wheezing.   Cardiovascular: Negative for chest pain and palpitations.  Gastrointestinal: Negative for abdominal pain, constipation, diarrhea, nausea and vomiting.  Genitourinary: Negative for dysuria, frequency and urgency.  Musculoskeletal: Negative for myalgias.  Neurological: Negative for dizziness, weakness and headaches.  Psychiatric/Behavioral: Negative for confusion.  All other systems reviewed and are negative.    Physical Exam Triage Vital Signs ED Triage Vitals  Enc Vitals Group     BP 06/12/20 1957 130/83     Pulse Rate 06/12/20 1957 89     Resp 06/12/20 1957 (!) 22     Temp 06/12/20 1957 98.6 F (37 C)     Temp Source 06/12/20 1957 Oral     SpO2 06/12/20 1957 95 %     Weight --      Height --      Head Circumference --      Peak Flow --      Pain Score 06/12/20 1956 2     Pain Loc --      Pain Edu? --      Excl. in GC? --    No data found.  Updated Vital Signs BP 130/83 (BP Location: Left Arm)   Pulse 89   Temp 98.6 F (37 C) (Oral)   Resp (!) 22   LMP 06/10/2020 (Exact Date)  SpO2 95%   Visual Acuity Right Eye Distance:   Left Eye Distance:   Bilateral Distance:    Right Eye Near:   Left Eye Near:    Bilateral Near:     Physical Exam Vitals reviewed.  Constitutional:      General: She is not in acute distress.    Appearance: Normal appearance. She is not ill-appearing.  HENT:     Head: Normocephalic and atraumatic.     Right Ear: Hearing, tympanic membrane, ear canal and external ear normal. No swelling or tenderness. There is no impacted cerumen. No mastoid tenderness. Tympanic membrane is not perforated, erythematous, retracted or bulging.     Left Ear: Hearing, tympanic membrane, ear canal and external ear normal. No swelling or tenderness. There is no impacted cerumen. No mastoid tenderness. Tympanic membrane is not perforated,  erythematous, retracted or bulging.     Nose:     Right Sinus: No maxillary sinus tenderness or frontal sinus tenderness.     Left Sinus: No maxillary sinus tenderness or frontal sinus tenderness.     Mouth/Throat:     Mouth: Mucous membranes are moist.     Pharynx: Uvula midline. No oropharyngeal exudate or posterior oropharyngeal erythema.     Tonsils: No tonsillar exudate.  Cardiovascular:     Rate and Rhythm: Normal rate and regular rhythm.     Heart sounds: Normal heart sounds.  Pulmonary:     Breath sounds: Normal air entry. Wheezing present. No decreased breath sounds, rhonchi or rales.     Comments: Scattered wheezes throughout Chest:     Chest wall: No tenderness.  Abdominal:     General: Abdomen is flat. Bowel sounds are normal.     Tenderness: There is no abdominal tenderness. There is no guarding or rebound.  Lymphadenopathy:     Cervical: No cervical adenopathy.  Neurological:     General: No focal deficit present.     Mental Status: She is alert and oriented to person, place, and time.  Psychiatric:        Attention and Perception: Attention and perception normal.        Mood and Affect: Mood and affect normal.        Behavior: Behavior normal. Behavior is cooperative.        Thought Content: Thought content normal.        Judgment: Judgment normal.      UC Treatments / Results  Labs (all labs ordered are listed, but only abnormal results are displayed) Labs Reviewed - No data to display  EKG   Radiology No results found.  Procedures Procedures (including critical care time)  Medications Ordered in UC Medications  albuterol (VENTOLIN HFA) 108 (90 Base) MCG/ACT inhaler 2 puff (2 puffs Inhalation Given 06/12/20 2025)  methylPREDNISolone sodium succinate (SOLU-MEDROL) 125 mg/2 mL injection 60 mg (60 mg Intramuscular Given 06/12/20 2024)    Initial Impression / Assessment and Plan / UC Course  I have reviewed the triage vital signs and the nursing  notes.  Pertinent labs & imaging results that were available during my care of the patient were reviewed by me and considered in my medical decision making (see chart for details).     This patient is a 45 year old female presenting with asthma exacerbation since running out of albuterol inhaler 2 days ago.. Today this pt is afebrile nontachycardic nontachypneic, oxygenating well on room air, wheezes throughout but no rhonchi or rales.   For asthma exacerbation, Solu-Medrol administered today, albuterol  inhaler provided.  Also sent a short course of prednisone as below.  She does not have diabetes.  Recommended trial of over-the-counter antihistamine.  ED return precautions discussed.  This chart was dictated using voice recognition software, Dragon. Despite the best efforts of this provider to proofread and correct errors, errors may still occur which can change documentation meaning.  Language line used for interpretation  Final Clinical Impressions(s) / UC Diagnoses   Final diagnoses:  Moderate persistent asthma with acute exacerbation  Language barrier     Discharge Instructions     -Starting tomorrow, take 2 pills of prednisone daily for 5 days in a row.  Take this earlier in the day as it can give you energy. -Albuterol inhaler as needed for shortness of breath and coughing. -Seek additional medical attention if you experience worsening symptoms despite treatment, like worsening shortness of breath, new chest pain, new fever/chills.    ED Prescriptions    Medication Sig Dispense Auth. Provider   predniSONE (DELTASONE) 20 MG tablet Take 2 tablets (40 mg total) by mouth daily for 5 days. 10 tablet Rhys Martini, PA-C     PDMP not reviewed this encounter.   Rhys Martini, PA-C 06/12/20 2026    Rhys Martini, PA-C 06/12/20 2026

## 2020-06-12 NOTE — Discharge Instructions (Addendum)
-  Starting tomorrow, take 2 pills of prednisone daily for 5 days in a row.  Take this earlier in the day as it can give you energy. -Albuterol inhaler as needed for shortness of breath and coughing. -Seek additional medical attention if you experience worsening symptoms despite treatment, like worsening shortness of breath, new chest pain, new fever/chills.

## 2020-06-12 NOTE — ED Triage Notes (Signed)
Pt presents with bilateral ear pain, nasal congestion and cough x 2 days.

## 2021-12-19 ENCOUNTER — Encounter (HOSPITAL_COMMUNITY): Payer: Self-pay | Admitting: *Deleted

## 2021-12-19 ENCOUNTER — Ambulatory Visit (INDEPENDENT_AMBULATORY_CARE_PROVIDER_SITE_OTHER): Payer: Self-pay

## 2021-12-19 ENCOUNTER — Ambulatory Visit (HOSPITAL_COMMUNITY)
Admission: EM | Admit: 2021-12-19 | Discharge: 2021-12-19 | Disposition: A | Discharge status: Home / Self Care | Payer: Self-pay | Attending: Family Medicine | Admitting: Family Medicine

## 2021-12-19 DIAGNOSIS — M25562 Pain in left knee: Secondary | ICD-10-CM

## 2021-12-19 DIAGNOSIS — M1712 Unilateral primary osteoarthritis, left knee: Secondary | ICD-10-CM

## 2021-12-19 MED ORDER — KETOROLAC TROMETHAMINE 30 MG/ML IJ SOLN
INTRAMUSCULAR | Status: AC
  Filled 2021-12-19: qty 1

## 2021-12-19 MED ORDER — IBUPROFEN 800 MG PO TABS: 800.0000 mg | ORAL_TABLET | Freq: Three times a day (TID) | ORAL | 0 refills | Status: DC | PRN

## 2021-12-19 MED ORDER — KETOROLAC TROMETHAMINE 30 MG/ML IJ SOLN
30.0000 mg | Freq: Once | INTRAMUSCULAR | Status: AC
  Administered 2021-12-19: 30 mg via INTRAMUSCULAR

## 2021-12-19 NOTE — ED Provider Notes (Addendum)
[place]    CSN: [id number] Arrival date & time: 12/19/21  [place]      History   Chief Complaint Chief Complaint  Patient presents with   Knee Pain    HPI Denise Sampson is a 46 y.o. female.    Knee Pain  Here for left knee pain.  She has had some left knee pain bothering her for a few months, and then today she made a movement to bring her ankle up to her right knee and she felt a pop in her medial knee.  Since then she has had a lot of pain in the posterior knee and in the medial left knee.  No fall or trauma She does not take any prescription medications at home     Past Medical History:  Diagnosis Date   Asthma     Patient Active Problem List   Diagnosis Date Noted   Asthma with acute exacerbation 06/01/2012    Past Surgical History:  Procedure Laterality Date   BREAST SURGERY     KNEE SURGERY      OB History   No obstetric history on file.      Home Medications    Prior to Admission medications   Medication Sig Start Date End Date Taking? Authorizing Provider  ibuprofen (ADVIL) 800 MG tablet Take 1 tablet (800 mg total) by mouth every 8 (eight) hours as needed (pain). 12/19/21  Yes Barrett Henle, MD    Family History History reviewed. No pertinent family history.  Social History Social History   Tobacco Use   Smoking status: Never   Smokeless tobacco: Never  Vaping Use   Vaping Use: Never used  Substance Use Topics   Alcohol use: No   Drug use: No     Allergies   Patient has no known allergies.   Review of Systems Review of Systems   Physical Exam Triage Vital Signs ED Triage Vitals  Enc Vitals Group     BP 12/19/21 1753 110/66     Pulse Rate 12/19/21 1753 86     Resp 12/19/21 1753 16     Temp 12/19/21 1753 (!) 97.5 F (36.4 C)     Temp Source 12/19/21 1753 Oral     SpO2 12/19/21 1753 100 %     Weight --      Height --      Head Circumference --      Peak Flow --      Pain Score  12/19/21 1754 9     Pain Loc --      Pain Edu? --      Excl. in [place]? --    No data found.  Updated Vital Signs BP 110/66   Pulse 86   Temp (!) 97.5 F (36.4 C) (Oral)   Resp 16   LMP 12/06/2021 (Approximate)   SpO2 100%   Visual Acuity Right Eye Distance:   Left Eye Distance:   Bilateral Distance:    Right Eye Near:   Left Eye Near:    Bilateral Near:     Physical Exam Vitals reviewed.  Constitutional:      General: She is not in acute distress.    Appearance: She is not ill-appearing, toxic-appearing or diaphoretic.  HENT:     Mouth/Throat:     Mouth: Mucous membranes are moist.     Pharynx: No oropharyngeal exudate or posterior oropharyngeal erythema.  Eyes:     Extraocular Movements: Extraocular movements intact.  Conjunctiva/sclera: Conjunctivae normal.     Pupils: Pupils are equal, round, and reactive to light.  Pulmonary:     Effort: Pulmonary effort is normal.  Musculoskeletal:     Comments: There is some swelling anteriorly of the knee.  There is possible mild effusion.  There is no erythema or ecchymosis.  Range of motion is normal, though flexion extension does cause some pain  Neurological:     General: No focal deficit present.     Mental Status: She is oriented to person, place, and time.  Psychiatric:        Behavior: Behavior normal.      UC Treatments / Results  Labs (all labs ordered are listed, but only abnormal results are displayed) Labs Reviewed - No data to display  EKG   Radiology DG Knee 2 Views Left  Result Date: 12/19/2021 CLINICAL DATA:  Left knee pain EXAM: LEFT KNEE - 1-2 VIEW COMPARISON:  None Available. FINDINGS: Normal alignment no fracture. Mild to moderate joint space narrowing medially and laterally with associated mild spurring. 1 cm calcified loose body posterior to the joint. Small joint effusion. IMPRESSION: Mild-to-moderate degenerative change in the left knee with small joint effusion and calcified loose body.  Electronically Signed   By: Franchot Gallo M.D.   On: 12/19/2021 18:33    Procedures Procedures (including critical care time)  Medications Ordered in UC Medications  ketorolac (TORADOL) 30 MG/ML injection 30 mg (30 mg Intramuscular Given 12/19/21 1904)    Initial Impression / Assessment and Plan / UC Course  I have reviewed the triage vital signs and the nursing notes.  Pertinent labs & imaging results that were available during my care of the patient were reviewed by me and considered in my medical decision making (see chart for details).     Entirety of visit was conducted in Spanish   No fractures.  There are degenerative changes in the left knee and some small amount of effusion and a calcified loose body.  We are going to provide pain relief and a sleeve knee brace.  She is given contact information for orthopedics. Final Clinical Impressions(s) / UC Diagnoses   Final diagnoses:  Acute pain of left knee  Primary osteoarthritis of left knee     Discharge Instructions      Your x-ray showed arthritis but no broken bones(no habia huesos rotos, pero habia artritis en las radiografias)  You have been given a shot of Toradol 30 mg today. ((Le hemos dado Toradol en inyeccion para dolor hoy)   Take ibuprofen 800 mg--1 tab every 8 hours as needed for pain.  ((Tome Ud. 800 mg --1 cada 8 horas cuando tiene dolor)        ED Prescriptions     Medication Sig Dispense Auth. Provider   ibuprofen (ADVIL) 800 MG tablet Take 1 tablet (800 mg total) by mouth every 8 (eight) hours as needed (pain). 21 tablet Eriq Hufford, Gwenlyn Perking, MD      PDMP not reviewed this encounter.   Barrett Henle, MD 12/19/21 Randol Kern    Barrett Henle, MD 12/19/21 Lanetta Inch    Barrett Henle, MD 12/19/21 1949

## 2021-12-19 NOTE — Discharge Instructions (Addendum)
Your x-ray showed arthritis but no broken bones(no habia huesos rotos, pero habia artritis en las radiografias)  You have been given a shot of Toradol 30 mg today. ((Le hemos dado Toradol en inyeccion para dolor hoy)   Take ibuprofen 800 mg--1 tab every 8 hours as needed for pain.  ((Tome Ud. 800 mg --1 cada 8 horas cuando tiene dolor)

## 2021-12-19 NOTE — ED Triage Notes (Signed)
Via [employer] video interpreter [phone]: C/O left knee pain x approx 1 month; states today "something cracked in my knee" and now c/o significant pain, feeling of weakness in knee, and difficulty with ROM. 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 Pt ambulates with limp.

## 2022-05-10 ENCOUNTER — Ambulatory Visit (HOSPITAL_COMMUNITY): Payer: Self-pay

## 2022-05-10 ENCOUNTER — Ambulatory Visit (HOSPITAL_COMMUNITY)
Admission: EM | Admit: 2022-05-10 | Discharge: 2022-05-10 | Disposition: A | Discharge status: Home / Self Care | Payer: Self-pay | Attending: Internal Medicine | Admitting: Internal Medicine

## 2022-05-10 ENCOUNTER — Encounter (HOSPITAL_COMMUNITY): Payer: Self-pay | Admitting: Emergency Medicine

## 2022-05-10 DIAGNOSIS — J4541 Moderate persistent asthma with (acute) exacerbation: Secondary | ICD-10-CM | POA: Insufficient documentation

## 2022-05-10 DIAGNOSIS — J069 Acute upper respiratory infection, unspecified: Secondary | ICD-10-CM | POA: Insufficient documentation

## 2022-05-10 DIAGNOSIS — Z1152 Encounter for screening for COVID-19: Secondary | ICD-10-CM | POA: Insufficient documentation

## 2022-05-10 DIAGNOSIS — R059 Cough, unspecified: Secondary | ICD-10-CM | POA: Insufficient documentation

## 2022-05-10 MED ORDER — GUAIFENESIN ER 1200 MG PO TB12: 1200.0000 mg | ORAL_TABLET | Freq: Two times a day (BID) | ORAL | 0 refills | Status: DC

## 2022-05-10 MED ORDER — ALBUTEROL SULFATE HFA 108 (90 BASE) MCG/ACT IN AERS: 1.0000 | INHALATION_SPRAY | Freq: Four times a day (QID) | RESPIRATORY_TRACT | 0 refills | Status: AC | PRN

## 2022-05-10 MED ORDER — IPRATROPIUM-ALBUTEROL 0.5-2.5 (3) MG/3ML IN SOLN
3.0000 mL | Freq: Once | RESPIRATORY_TRACT | Status: AC
  Administered 2022-05-10: 3 mL via RESPIRATORY_TRACT

## 2022-05-10 MED ORDER — BENZONATATE 100 MG PO CAPS: 100.0000 mg | ORAL_CAPSULE | Freq: Three times a day (TID) | ORAL | 0 refills | Status: DC

## 2022-05-10 MED ORDER — IPRATROPIUM-ALBUTEROL 0.5-2.5 (3) MG/3ML IN SOLN
RESPIRATORY_TRACT | Status: AC
  Filled 2022-05-10: qty 3

## 2022-05-10 MED ORDER — METHYLPREDNISOLONE SODIUM SUCC 125 MG IJ SOLR
80.0000 mg | Freq: Once | INTRAMUSCULAR | Status: AC
  Administered 2022-05-10: 80 mg via INTRAMUSCULAR

## 2022-05-10 MED ORDER — METHYLPREDNISOLONE SODIUM SUCC 125 MG IJ SOLR
INTRAMUSCULAR | Status: AC
  Filled 2022-05-10: qty 2

## 2022-05-10 MED ORDER — PREDNISONE 20 MG PO TABS: 40.0000 mg | ORAL_TABLET | Freq: Every day | ORAL | 0 refills | Status: AC

## 2022-05-10 NOTE — Discharge Instructions (Addendum)
Tiene un ataque de asma que probablemente sea provocado por una infeccin viral de las vas respiratorias superiores. Los siguientes medicamentos le ayudarn con sus sntomas. Las pruebas de COVID-19 estn pendientes y volvern en las prximas 12 a 24 horas.  - Tomar las pastillas de esteroides enviadas a la farmacia segn las indicaciones. No tome ningn otro medicamento que contenga AINE, como ibuprofeno o naproxeno/Aleve, mientras toma prednisona. - Empieza a tomar las pastillas de esteroides maana ya que te puse la inyeccin de esteroides en el consultorio. - Puede usar Forensic psychologist de albuterol de 1 a 2 inhalaciones cada 4 a 6 horas, segn sea necesario, para la tos, la dificultad para respirar y las sibilancias. - Tome los medicamentos para la tos segn lo prescrito. - Contine usando los medicamentos de venta libre segn sea necesario segn las indicaciones. El mucinex simple (guaifenesina) de venta libre puede ayudar an ms a Animator mucosidad y [employer].  Si desarrolla algn sntoma nuevo o que empeora o no mejora en los prximos 2 a 3 das, regrese. Si sus sntomas son graves, acuda a la sala de Multimedia programmer. Haga un seguimiento con su proveedor de atencin primaria para una evaluacin y control adicionales de sus sntomas, as como visitas de Therapist, sports continuas. Espero que se sienta mejor!   You have an asthma attack that is likely triggered by a viral upper respiratory infection. The following medicines will help with your symptoms.  COVID-19 testing is pending and will come back in the next 12-24 hours.   - Take steroid pills sent to pharmacy as directed. Do not take any other NSAID containing medications such as ibuprofen or naproxen/Aleve while taking prednisone. - Start taking the steroid pills tomorrow since I gave you the shot of steroid in the office. - You may use albuterol inhaler 1 to 2 puffs every 4-6 hours as needed for cough, shortness of breath, and  wheezing. - Take cough medicines as prescribed.  - Continue using over the counter medicines as needed as directed. Plain mucinex (guaifenesin) over the counter may further help breakup mucus and help with symptoms.   If you develop any new or worsening symptoms or do not improve in the next 2 to 3 days, please return.  If your symptoms are severe, please go to the emergency room.  Follow-up with your primary care provider for further evaluation and management of your symptoms as well as ongoing wellness visits.  I hope you feel better!

## 2022-05-10 NOTE — ED Triage Notes (Signed)
Used interpretor  Pt c/o back pain since Wed night. Took tylenol yesterday. Pt has cough that is all the time but is intermittent.  Pt denies lifting or injury.

## 2022-05-10 NOTE — ED Provider Notes (Signed)
[place]    CSN: [id number] Arrival date & time: 05/10/22  [id number]      History   Chief Complaint Chief Complaint  Patient presents with   Back Pain    HPI Denise Sampson is a 47 y.o. female.   Patient presents to urgent care for evaluation of left upper back pain, cough, shortness of breath, nasal congestion, and generalized fatigue for the last 2 days. States she has been coughing intermittently over the last few days and cough is productive with clear phlegm. She has a history of asthma and denies recent asthma exacerbations, antibiotic use, or steroid use.  Denies recent sick contacts with her symptoms.  Tolerating food and fluids well without nausea, vomiting, abdominal pain, low back discomfort, urinary symptoms, fever/chills, and sore throat.  She reports significant nasal congestion.  Cough is worse when she lays down to go to bed at night, denies orthopnea and leg swelling. No rash, chest pain, or heart palpitations reported. She is not a diabetic.  She does not have access to an MDI as she ran out of her previous albuterol inhaler and does not have a primary care provider to get a refill.  She has been using Tylenol over-the-counter to help with upper back pain without relief.  Currently afebrile.  Upper back pain is currently 9 on a scale 0-10 and worsened by coughing.  No recent travel, hemoptysis, or abdominal pain.   Back Pain   Past Medical History:  Diagnosis Date   Asthma     Patient Active Problem List   Diagnosis Date Noted   Asthma with acute exacerbation 06/01/2012    Past Surgical History:  Procedure Laterality Date   BREAST SURGERY     KNEE SURGERY      OB History   No obstetric history on file.      Home Medications    Prior to Admission medications   Medication Sig Start Date End Date Taking? Authorizing Provider  albuterol (VENTOLIN HFA) 108 (90 Base) MCG/ACT inhaler Inhale 1-2 puffs into the lungs every 6 (six) hours  as needed for wheezing or shortness of breath. 05/10/22  Yes Talbot Grumbling, FNP  benzonatate (TESSALON) 100 MG capsule Take 1 capsule (100 mg total) by mouth every 8 (eight) hours. 05/10/22  Yes Talbot Grumbling, FNP  Guaifenesin 1200 MG TB12 Take 1 tablet (1,200 mg total) by mouth in the morning and at bedtime. 05/10/22  Yes Talbot Grumbling, FNP  predniSONE (DELTASONE) 20 MG tablet Take 2 tablets (40 mg total) by mouth daily for 5 days. 05/10/22 05/15/22 Yes Talbot Grumbling, FNP  ibuprofen (ADVIL) 800 MG tablet Take 1 tablet (800 mg total) by mouth every 8 (eight) hours as needed (pain). 12/19/21   Barrett Henle, MD    Family History No family history on file.  Social History Social History   Tobacco Use   Smoking status: Never   Smokeless tobacco: Never  Vaping Use   Vaping Use: Never used  Substance Use Topics   Alcohol use: No   Drug use: No     Allergies   Patient has no known allergies.   Review of Systems Review of Systems  Musculoskeletal:  Positive for back pain.  Per HPI   Physical Exam Triage Vital Signs ED Triage Vitals  Enc Vitals Group     BP 05/10/22 1143 137/88     Pulse Rate 05/10/22 1143 80     Resp 05/10/22 1143 18  Temp 05/10/22 1143 (!) 97.4 F (36.3 C)     Temp Source 05/10/22 1143 Oral     SpO2 05/10/22 1143 96 %     Weight --      Height --      Head Circumference --      Peak Flow --      Pain Score 05/10/22 1139 9     Pain Loc --      Pain Edu? --      Excl. in [place]? --    No data found.  Updated Vital Signs BP 137/88 (BP Location: Right Arm)   Pulse 80   Temp (!) 97.4 F (36.3 C) (Oral)   Resp 18   LMP 04/11/2022   SpO2 96%   Visual Acuity Right Eye Distance:   Left Eye Distance:   Bilateral Distance:    Right Eye Near:   Left Eye Near:    Bilateral Near:     Physical Exam Vitals and nursing note reviewed.  Constitutional:      Appearance: She is obese. She is ill-appearing. She is not  toxic-appearing.  HENT:     Head: Normocephalic and atraumatic.     Right Ear: Hearing, tympanic membrane, ear canal and external ear normal.     Left Ear: Hearing, tympanic membrane, ear canal and external ear normal.     Nose: Congestion present.     Mouth/Throat:     Lips: Pink.     Mouth: Mucous membranes are moist. No injury.     Tongue: No lesions. Tongue does not deviate from midline.     Palate: No mass and lesions.     Pharynx: Oropharynx is clear. Uvula midline. No pharyngeal swelling, oropharyngeal exudate, posterior oropharyngeal erythema or uvula swelling.     Tonsils: No tonsillar exudate or tonsillar abscesses.  Eyes:     General: Lids are normal. Vision grossly intact. Gaze aligned appropriately.     Extraocular Movements: Extraocular movements intact.     Conjunctiva/sclera: Conjunctivae normal.  Cardiovascular:     Rate and Rhythm: Normal rate and regular rhythm.     Heart sounds: Normal heart sounds, S1 normal and S2 normal.  Pulmonary:     Effort: Pulmonary effort is normal. No respiratory distress.     Breath sounds: Normal air entry. Wheezing present. No rhonchi or rales.     Comments: Faint expiratory wheeze to the diffuse lungs bilaterally without evidence of rhonchi or crackles. No respiratory distress, retractions, or tachypnea.  Musculoskeletal:        General: Tenderness present.     Cervical back: Normal and neck supple.     Thoracic back: Tenderness present. No swelling, edema, deformity, signs of trauma, lacerations, spasms or bony tenderness. Normal range of motion. No scoliosis.     Lumbar back: Normal.       Back:     Right lower leg: No edema.     Left lower leg: No edema.     Comments: TTP to the paraspinals of the left upper thoracic spine.  Lymphadenopathy:     Cervical: No cervical adenopathy.  Skin:    General: Skin is warm and dry.     Capillary Refill: Capillary refill takes less than 2 seconds.     Findings: No rash.  Neurological:      General: No focal deficit present.     Mental Status: She is alert and oriented to person, place, and time. Mental status is at baseline.  Cranial Nerves: No dysarthria or facial asymmetry.     Motor: No weakness.     Gait: Gait normal.  Psychiatric:        Mood and Affect: Mood normal.        Speech: Speech normal.        Behavior: Behavior normal.        Thought Content: Thought content normal.        Judgment: Judgment normal.      UC Treatments / Results  Labs (all labs ordered are listed, but only abnormal results are displayed) Labs Reviewed  SARS CORONAVIRUS 2 (TAT 6-24 HRS)    EKG   Radiology No results found.  Procedures Procedures (including critical care time)  Medications Ordered in UC Medications  ipratropium-albuterol (DUONEB) 0.5-2.5 (3) MG/3ML nebulizer solution 3 mL (3 mLs Nebulization Given 05/10/22 1233)  methylPREDNISolone sodium succinate (SOLU-MEDROL) 125 mg/2 mL injection 80 mg (80 mg Intramuscular Given 05/10/22 1231)    Initial Impression / Assessment and Plan / UC Course  I have reviewed the triage vital signs and the nursing notes.  Pertinent labs & imaging results that were available during my care of the patient were reviewed by me and considered in my medical decision making (see chart for details).   1. Moderate persistent asthma with acute exacerbation, viral URI with cough Presentation is consistent with acute moderate asthma exacerbation likely triggered by viral upper respiratory tract infection.  COVID-19 testing is pending and will come back in the next 12 to 24 hours, patient is a candidate for antiviral therapy if test positive.  Otherwise, we will manage this as an acute asthma exacerbation.  Patient given DuoNeb in clinic with significant improvement in subjective shortness of breath and lung sounds afterwards.  Solu-Medrol IM 80 mg provided.  Upper back discomfort likely due to muscular contraction related to increased coughing.   Lung sounds fully cleared after DuoNeb treatment.  Low suspicion for acute cardiopulmonary finding based on stable cardiopulmonary exam findings and hemodynamically stable vital signs, therefore deferred imaging of the chest.  She is agreeable with this plan.  Guaifenesin, albuterol MDI, Tessalon Perles, and prednisone steroid burst for 5 days sent to pharmacy to be taken as directed.  No NSAID while taking prednisone, advised to start prednisone tomorrow due to steroid injection in clinic. Strict ER and urgent care return precautions discussed. Expresses understanding and agreement with plan.  Discussed physical exam and available lab work findings in clinic with patient.  Counseled patient regarding appropriate use of medications and potential side effects for all medications recommended or prescribed today. Discussed red flag signs and symptoms of worsening condition,when to call the PCP office, return to urgent care, and when to seek higher level of care in the emergency department. Patient verbalizes understanding and agreement with plan. All questions answered. Patient discharged in stable condition.    Final Clinical Impressions(s) / UC Diagnoses   Final diagnoses:  Moderate persistent asthma with acute exacerbation  Viral URI with cough     Discharge Instructions      Tiene un ataque de asma que probablemente sea provocado por una infeccin viral de las vas respiratorias superiores. Los siguientes medicamentos le ayudarn con sus sntomas. Las pruebas de COVID-19 estn pendientes y volvern en las prximas 12 a 24 horas.  - Tomar las pastillas de esteroides enviadas a la farmacia segn las indicaciones. No tome ningn otro medicamento que contenga AINE, como ibuprofeno o naproxeno/Aleve, mientras toma prednisona. Dione Booze a tomar  las pastillas de esteroides maana ya que te puse la inyeccin de esteroides en el consultorio. - Puede usar Forensic psychologist de albuterol de 1 a 2 inhalaciones  cada 4 a 6 horas, segn sea necesario, para la tos, la dificultad para respirar y las sibilancias. - Tome los medicamentos para la tos segn lo prescrito. - Contine usando los medicamentos de venta libre segn sea necesario segn las indicaciones. El mucinex simple (guaifenesina) de venta libre puede ayudar an ms a Animator mucosidad y [employer].  Si desarrolla algn sntoma nuevo o que empeora o no mejora en los prximos 2 a 3 das, regrese. Si sus sntomas son graves, acuda a la sala de Multimedia programmer. Haga un seguimiento con su proveedor de atencin primaria para una evaluacin y control adicionales de sus sntomas, as como visitas de Therapist, sports continuas. Espero que se sienta mejor!   You have an asthma attack that is likely triggered by a viral upper respiratory infection. The following medicines will help with your symptoms.  COVID-19 testing is pending and will come back in the next 12-24 hours.   - Take steroid pills sent to pharmacy as directed. Do not take any other NSAID containing medications such as ibuprofen or naproxen/Aleve while taking prednisone. - Start taking the steroid pills tomorrow since I gave you the shot of steroid in the office. - You may use albuterol inhaler 1 to 2 puffs every 4-6 hours as needed for cough, shortness of breath, and wheezing. - Take cough medicines as prescribed.  - Continue using over the counter medicines as needed as directed. Plain mucinex (guaifenesin) over the counter may further help breakup mucus and help with symptoms.   If you develop any new or worsening symptoms or do not improve in the next 2 to 3 days, please return.  If your symptoms are severe, please go to the emergency room.  Follow-up with your primary care provider for further evaluation and management of your symptoms as well as ongoing wellness visits.  I hope you feel better!     ED Prescriptions     Medication Sig Dispense Auth. Provider   benzonatate  (TESSALON) 100 MG capsule Take 1 capsule (100 mg total) by mouth every 8 (eight) hours. 21 capsule Joella Prince M, FNP   Guaifenesin 1200 MG TB12 Take 1 tablet (1,200 mg total) by mouth in the morning and at bedtime. 14 tablet Talbot Grumbling, FNP   albuterol (VENTOLIN HFA) 108 (90 Base) MCG/ACT inhaler Inhale 1-2 puffs into the lungs every 6 (six) hours as needed for wheezing or shortness of breath. 18 g Joella Prince M, FNP   predniSONE (DELTASONE) 20 MG tablet Take 2 tablets (40 mg total) by mouth daily for 5 days. 10 tablet Talbot Grumbling, FNP      PDMP not reviewed this encounter.   Talbot Grumbling, [place] 05/10/22 1317

## 2022-05-11 LAB — SARS CORONAVIRUS 2 (TAT 6-24 HRS): SARS Coronavirus 2: NEGATIVE

## 2022-08-19 ENCOUNTER — Other Ambulatory Visit: Payer: Self-pay

## 2022-08-19 ENCOUNTER — Ambulatory Visit (HOSPITAL_COMMUNITY)
Admission: EM | Admit: 2022-08-19 | Discharge: 2022-08-19 | Disposition: A | Discharge status: Home / Self Care | Payer: Self-pay | Attending: Emergency Medicine | Admitting: Emergency Medicine

## 2022-08-19 ENCOUNTER — Encounter (HOSPITAL_COMMUNITY): Payer: Self-pay | Admitting: *Deleted

## 2022-08-19 DIAGNOSIS — J019 Acute sinusitis, unspecified: Secondary | ICD-10-CM | POA: Insufficient documentation

## 2022-08-19 DIAGNOSIS — J4521 Mild intermittent asthma with (acute) exacerbation: Secondary | ICD-10-CM | POA: Insufficient documentation

## 2022-08-19 DIAGNOSIS — B9689 Other specified bacterial agents as the cause of diseases classified elsewhere: Secondary | ICD-10-CM | POA: Insufficient documentation

## 2022-08-19 LAB — POCT RAPID STREP A (OFFICE): Rapid Strep A Screen: NEGATIVE

## 2022-08-19 MED ORDER — DOXYCYCLINE HYCLATE 100 MG PO CAPS: 100.0000 mg | ORAL_CAPSULE | Freq: Two times a day (BID) | ORAL | 0 refills | Status: AC

## 2022-08-19 MED ORDER — ALBUTEROL SULFATE HFA 108 (90 BASE) MCG/ACT IN AERS
2.0000 | INHALATION_SPRAY | Freq: Once | RESPIRATORY_TRACT | Status: AC
  Administered 2022-08-19: 2 via RESPIRATORY_TRACT

## 2022-08-19 MED ORDER — DOXYCYCLINE HYCLATE 100 MG PO CAPS: 100.0000 mg | ORAL_CAPSULE | Freq: Two times a day (BID) | ORAL | 0 refills | Status: DC

## 2022-08-19 MED ORDER — FLUTICASONE PROPIONATE 50 MCG/ACT NA SUSP: 2.0000 | Freq: Every day | NASAL | 2 refills | Status: AC

## 2022-08-19 MED ORDER — ALBUTEROL SULFATE HFA 108 (90 BASE) MCG/ACT IN AERS
INHALATION_SPRAY | RESPIRATORY_TRACT | Status: AC
  Filled 2022-08-19: qty 6.7

## 2022-08-19 NOTE — Discharge Instructions (Addendum)
Please use your albuterol inhaler 3 times daily (every 6 hours) for the next several days. Take the antibiotic twice daily for 5 days. Take with food to avoid upset stomach. Continue ibuprofen and tylenol for aches Drink lots of water! I would also use once daily nasal spray like flonase  Use su inhalador de albuterol 3 veces al da (cada 6 horas) durante los [employer]. Tome el antibitico dos veces al da durante 5 Dora. Tmelo con alimentos para Programme researcher, broadcasting/film/video. Contine con el ibuprofeno y el tylenol para los dolores Bebe mucha agua! Tambin usara un aerosol nasal una vez al da como flonase  Por favor, devulvalo si no hay mejora despus de los medicamentos

## 2022-08-19 NOTE — ED Triage Notes (Signed)
Pt reports nasal congestion for 3-4 weeeks. Pt now reports she has sore throat and neck pain .

## 2022-08-19 NOTE — ED Provider Notes (Signed)
[facility]    CSN: [id number] Arrival date & time: 08/19/22  1723      History   Chief Complaint Chief Complaint  Patient presents with   Nasal Congestion   Sore Throat    HPI Denise Sampson is a 47 y.o. female.  Medical interpreter used this encounter 3-4 week history of nasal congestion. No period improvement, just persisting.  Having sinus pressure and facial pain. Also cough for a week or so. History of asthma but does not have an inhaler.  She denies any shortness of breath or trouble breathing 2 or 3 days ago she developed sore throat, 9/10 pain with swallowing Denies fever or chills.  No abdominal pain, NVD, rash  No known sick contacts  She has tried Tylenol and ibuprofen, and nasal spray name unknown. Also an OTC pill for congestion   Past Medical History:  Diagnosis Date   Asthma     Patient Active Problem List   Diagnosis Date Noted   Asthma with acute exacerbation 06/01/2012    Past Surgical History:  Procedure Laterality Date   BREAST SURGERY     KNEE SURGERY      OB History   No obstetric history on file.      Home Medications    Prior to Admission medications   Medication Sig Start Date End Date Taking? Authorizing Provider  albuterol (VENTOLIN HFA) 108 (90 Base) MCG/ACT inhaler Inhale 1-2 puffs into the lungs every 6 (six) hours as needed for wheezing or shortness of breath. 05/10/22  Yes Carlisle Beers, FNP  fluticasone (FLONASE) 50 MCG/ACT nasal spray Place 2 sprays into both nostrils daily. 08/19/22  Yes Rose-Marie Hickling, Lurena Joiner, PA-C  doxycycline (VIBRAMYCIN) 100 MG capsule Take 1 capsule (100 mg total) by mouth 2 (two) times daily for 5 days. Dos veces al dia durante cinco dias 08/19/22 08/24/22  Daiki Dicostanzo, Ray Church    Family History History reviewed. No pertinent family history.  Social History Social History   Tobacco Use   Smoking status: Never   Smokeless tobacco: Never  Vaping Use   Vaping Use: Never  used  Substance Use Topics   Alcohol use: No   Drug use: No     Allergies   Patient has no known allergies.   Review of Systems Review of Systems As per HPI  Physical Exam Triage Vital Signs ED Triage Vitals  Enc Vitals Group     BP 08/19/22 1840 135/79     Pulse Rate 08/19/22 1840 82     Resp 08/19/22 1840 20     Temp 08/19/22 1840 98.4 F (36.9 C)     Temp src --      SpO2 08/19/22 1840 97 %     Weight --      Height --      Head Circumference --      Peak Flow --      Pain Score 08/19/22 1842 9     Pain Loc --      Pain Edu? --      Excl. in GC? --    No data found.  Updated Vital Signs BP 135/79   Pulse 82   Temp 98.4 F (36.9 C)   Resp 20   LMP 07/24/2022   SpO2 97%    Physical Exam Vitals and nursing note reviewed.  Constitutional:      General: She is not in acute distress. HENT:     Nose: Congestion present. No rhinorrhea.  Right Turbinates: Swollen.     Left Turbinates: Swollen.     Comments: Speech with nasal quality. Swollen turbinates and congestion     Mouth/Throat:     Mouth: Mucous membranes are moist.     Pharynx: Oropharynx is clear. Posterior oropharyngeal erythema (mild) present. No pharyngeal swelling or oropharyngeal exudate.     Tonsils: No tonsillar exudate.  Eyes:     Conjunctiva/sclera: Conjunctivae normal.  Cardiovascular:     Rate and Rhythm: Normal rate and regular rhythm.     Pulses: Normal pulses.     Heart sounds: Normal heart sounds.  Pulmonary:     Effort: Pulmonary effort is normal. No respiratory distress.     Breath sounds: Wheezing (faint, inspiratory) present.  Abdominal:     Palpations: Abdomen is soft.  Musculoskeletal:     Cervical back: Normal range of motion. No rigidity or tenderness.  Lymphadenopathy:     Cervical: No cervical adenopathy.  Skin:    General: Skin is warm and dry.  Neurological:     Mental Status: She is alert and oriented to person, place, and time.     UC Treatments /  Results  Labs (all labs ordered are listed, but only abnormal results are displayed) Labs Reviewed  CULTURE, GROUP A STREP [facility])  POCT RAPID STREP A (OFFICE)    EKG   Radiology No results found.  Procedures Procedures (including critical care time)  Medications Ordered in UC Medications  albuterol (VENTOLIN HFA) 108 (90 Base) MCG/ACT inhaler 2 puff (2 puffs Inhalation Given 08/19/22 1934)    Initial Impression / Assessment and Plan / UC Course  I have reviewed the triage vital signs and the nursing notes.  Pertinent labs & imaging results that were available during my care of the patient were reviewed by me and considered in my medical decision making (see chart for details).  Rapid strep negative.  Culture pending. Discussed with 3 to 4-week history of continuing nasal congestion, cover for acute bacterial sinusitis. Will also cover for cough with doxycycline BID x 5 days. Faint inspiratory wheezing on lung exam.  No acute distress or increased work of breathing.  She is sating well on room air.  2 puffs inhaler given in clinic, advised to use every 6 hours at home. Return and ED precautions discussed. Patient agrees to plan  Final Clinical Impressions(s) / UC Diagnoses   Final diagnoses:  Acute bacterial sinusitis  Mild intermittent asthma with acute exacerbation     Discharge Instructions      Please use your albuterol inhaler 3 times daily (every 6 hours) for the next several days. Take the antibiotic twice daily for 5 days. Take with food to avoid upset stomach. Continue ibuprofen and tylenol for aches Drink lots of water! I would also use once daily nasal spray like flonase  Use su inhalador de albuterol 3 veces al da (cada 6 horas) durante los [employer]. Tome el antibitico dos veces al da durante 5 [place]. Tmelo con alimentos para Programme researcher, broadcasting/film/video. Contine con el ibuprofeno y el tylenol para los dolores Bebe mucha agua! Tambin usara un  aerosol nasal una vez al da como flonase  Por favor, devulvalo si no hay mejora despus de los [employer]    ED Prescriptions     Medication Sig Dispense Auth. Provider   doxycycline (VIBRAMYCIN) 100 MG capsule  (Status: Discontinued) Take 1 capsule (100 mg total) by mouth 2 (two) times daily for 5 days. Dos veces al  dia durante 7 dias 10 capsule Anala Whisenant, PA-C   fluticasone (FLONASE) 50 MCG/ACT nasal spray Place 2 sprays into both nostrils daily. 9.9 mL Loraine Freid, PA-C   doxycycline (VIBRAMYCIN) 100 MG capsule Take 1 capsule (100 mg total) by mouth 2 (two) times daily for 5 days. Dos veces al dia durante cinco dias 10 capsule Carrissa Taitano, Lurena Joiner, [place]      PDMP not reviewed this encounter.   Chao Blazejewski, Ray Church 08/19/22 2052

## 2022-08-22 LAB — CULTURE, GROUP A STREP (THRC)

## 2024-03-23 ENCOUNTER — Other Ambulatory Visit: Payer: Self-pay | Admitting: Obstetrics & Gynecology

## 2024-03-23 ENCOUNTER — Telehealth: Payer: Self-pay

## 2024-03-23 DIAGNOSIS — Z1231 Encounter for screening mammogram for malignant neoplasm of breast: Secondary | ICD-10-CM

## 2024-03-23 NOTE — Telephone Encounter (Signed)
 Telephoned patient at mobile number using interpreter, Arnetta Cheek. Left a voice message with [facility] (scholarship) scheduling contact information.
